# Patient Record
Sex: Female | Born: 1935 | Race: White | Hispanic: No | Smoking: Never smoker
Health system: Southern US, Community
[De-identification: ages and names within clinical notes are randomized; demographics above are authoritative.]

## PROBLEM LIST (undated history)

## (undated) DIAGNOSIS — E785 Hyperlipidemia, unspecified: Secondary | ICD-10-CM

## (undated) DIAGNOSIS — I10 Essential (primary) hypertension: Secondary | ICD-10-CM

## (undated) DIAGNOSIS — K219 Gastro-esophageal reflux disease without esophagitis: Secondary | ICD-10-CM

## (undated) DIAGNOSIS — H3530 Unspecified macular degeneration: Secondary | ICD-10-CM

## (undated) DIAGNOSIS — H409 Unspecified glaucoma: Secondary | ICD-10-CM

## (undated) DIAGNOSIS — E119 Type 2 diabetes mellitus without complications: Secondary | ICD-10-CM

## (undated) HISTORY — DX: Unspecified macular degeneration: H35.30

## (undated) HISTORY — DX: Hyperlipidemia, unspecified: E78.5

## (undated) HISTORY — DX: Gastro-esophageal reflux disease without esophagitis: K21.9

## (undated) HISTORY — DX: Unspecified glaucoma: H40.9

## (undated) HISTORY — DX: Type 2 diabetes mellitus without complications: E11.9

## (undated) HISTORY — DX: Essential (primary) hypertension: I10

---

## 1998-12-12 ENCOUNTER — Emergency Department (HOSPITAL_COMMUNITY): Admission: EM | Admit: 1998-12-12 | Discharge: 1998-12-12 | Payer: Self-pay | Admitting: Emergency Medicine

## 1998-12-12 ENCOUNTER — Encounter: Payer: Self-pay | Admitting: Emergency Medicine

## 1999-04-09 ENCOUNTER — Other Ambulatory Visit: Admission: RE | Admit: 1999-04-09 | Discharge: 1999-04-09 | Payer: Self-pay | Admitting: Family Medicine

## 2002-06-28 ENCOUNTER — Other Ambulatory Visit: Admission: RE | Admit: 2002-06-28 | Discharge: 2002-06-28 | Payer: Self-pay | Admitting: Family Medicine

## 2002-07-24 ENCOUNTER — Encounter: Admission: RE | Admit: 2002-07-24 | Discharge: 2002-10-22 | Payer: Self-pay | Admitting: Family Medicine

## 2003-07-31 ENCOUNTER — Encounter: Admission: RE | Admit: 2003-07-31 | Discharge: 2003-10-29 | Payer: Self-pay | Admitting: Family Medicine

## 2003-11-02 ENCOUNTER — Encounter: Admission: RE | Admit: 2003-11-02 | Discharge: 2003-11-02 | Payer: Self-pay | Admitting: Family Medicine

## 2004-04-08 ENCOUNTER — Ambulatory Visit (HOSPITAL_COMMUNITY): Admission: RE | Admit: 2004-04-08 | Discharge: 2004-04-08 | Payer: Self-pay | Admitting: *Deleted

## 2011-09-28 ENCOUNTER — Ambulatory Visit
Admission: RE | Admit: 2011-09-28 | Discharge: 2011-09-28 | Disposition: A | Discharge status: Home / Self Care | Payer: Medicare Other | Source: Ambulatory Visit | Attending: Family Medicine | Admitting: Family Medicine

## 2011-09-28 ENCOUNTER — Other Ambulatory Visit: Payer: Self-pay | Admitting: Family Medicine

## 2011-09-28 DIAGNOSIS — R42 Dizziness and giddiness: Secondary | ICD-10-CM

## 2011-11-18 DIAGNOSIS — I69998 Other sequelae following unspecified cerebrovascular disease: Secondary | ICD-10-CM | POA: Diagnosis not present

## 2011-11-20 DIAGNOSIS — R42 Dizziness and giddiness: Secondary | ICD-10-CM | POA: Diagnosis not present

## 2011-11-24 DIAGNOSIS — R42 Dizziness and giddiness: Secondary | ICD-10-CM | POA: Diagnosis not present

## 2011-11-26 DIAGNOSIS — R42 Dizziness and giddiness: Secondary | ICD-10-CM | POA: Diagnosis not present

## 2011-12-01 DIAGNOSIS — I69998 Other sequelae following unspecified cerebrovascular disease: Secondary | ICD-10-CM | POA: Diagnosis not present

## 2011-12-03 DIAGNOSIS — I69998 Other sequelae following unspecified cerebrovascular disease: Secondary | ICD-10-CM | POA: Diagnosis not present

## 2011-12-08 DIAGNOSIS — R42 Dizziness and giddiness: Secondary | ICD-10-CM | POA: Diagnosis not present

## 2011-12-10 DIAGNOSIS — R42 Dizziness and giddiness: Secondary | ICD-10-CM | POA: Diagnosis not present

## 2011-12-14 DIAGNOSIS — R42 Dizziness and giddiness: Secondary | ICD-10-CM | POA: Diagnosis not present

## 2011-12-14 DIAGNOSIS — I69998 Other sequelae following unspecified cerebrovascular disease: Secondary | ICD-10-CM | POA: Diagnosis not present

## 2011-12-17 DIAGNOSIS — R42 Dizziness and giddiness: Secondary | ICD-10-CM | POA: Diagnosis not present

## 2012-04-29 DIAGNOSIS — E119 Type 2 diabetes mellitus without complications: Secondary | ICD-10-CM | POA: Diagnosis not present

## 2012-04-29 DIAGNOSIS — E7800 Pure hypercholesterolemia, unspecified: Secondary | ICD-10-CM | POA: Diagnosis not present

## 2012-05-09 DIAGNOSIS — E119 Type 2 diabetes mellitus without complications: Secondary | ICD-10-CM | POA: Diagnosis not present

## 2012-05-09 DIAGNOSIS — M810 Age-related osteoporosis without current pathological fracture: Secondary | ICD-10-CM | POA: Diagnosis not present

## 2012-05-09 DIAGNOSIS — R42 Dizziness and giddiness: Secondary | ICD-10-CM | POA: Diagnosis not present

## 2012-05-09 DIAGNOSIS — E7800 Pure hypercholesterolemia, unspecified: Secondary | ICD-10-CM | POA: Diagnosis not present

## 2012-05-09 DIAGNOSIS — I10 Essential (primary) hypertension: Secondary | ICD-10-CM | POA: Diagnosis not present

## 2012-07-30 DIAGNOSIS — Z23 Encounter for immunization: Secondary | ICD-10-CM | POA: Diagnosis not present

## 2012-10-05 DIAGNOSIS — H04129 Dry eye syndrome of unspecified lacrimal gland: Secondary | ICD-10-CM | POA: Diagnosis not present

## 2012-10-05 DIAGNOSIS — H3530 Unspecified macular degeneration: Secondary | ICD-10-CM | POA: Diagnosis not present

## 2012-10-05 DIAGNOSIS — E119 Type 2 diabetes mellitus without complications: Secondary | ICD-10-CM | POA: Diagnosis not present

## 2012-10-05 DIAGNOSIS — H2640 Unspecified secondary cataract: Secondary | ICD-10-CM | POA: Diagnosis not present

## 2012-10-11 DIAGNOSIS — H2640 Unspecified secondary cataract: Secondary | ICD-10-CM | POA: Diagnosis not present

## 2012-10-11 DIAGNOSIS — H26499 Other secondary cataract, unspecified eye: Secondary | ICD-10-CM | POA: Diagnosis not present

## 2012-10-21 DIAGNOSIS — E119 Type 2 diabetes mellitus without complications: Secondary | ICD-10-CM | POA: Diagnosis not present

## 2012-10-21 DIAGNOSIS — E7800 Pure hypercholesterolemia, unspecified: Secondary | ICD-10-CM | POA: Diagnosis not present

## 2012-10-26 DIAGNOSIS — I10 Essential (primary) hypertension: Secondary | ICD-10-CM | POA: Diagnosis not present

## 2012-10-26 DIAGNOSIS — R209 Unspecified disturbances of skin sensation: Secondary | ICD-10-CM | POA: Diagnosis not present

## 2012-10-26 DIAGNOSIS — E7800 Pure hypercholesterolemia, unspecified: Secondary | ICD-10-CM | POA: Diagnosis not present

## 2012-10-26 DIAGNOSIS — M810 Age-related osteoporosis without current pathological fracture: Secondary | ICD-10-CM | POA: Diagnosis not present

## 2012-10-26 DIAGNOSIS — R42 Dizziness and giddiness: Secondary | ICD-10-CM | POA: Diagnosis not present

## 2012-10-26 DIAGNOSIS — E119 Type 2 diabetes mellitus without complications: Secondary | ICD-10-CM | POA: Diagnosis not present

## 2013-02-01 DIAGNOSIS — M19079 Primary osteoarthritis, unspecified ankle and foot: Secondary | ICD-10-CM | POA: Diagnosis not present

## 2013-02-16 DIAGNOSIS — M19079 Primary osteoarthritis, unspecified ankle and foot: Secondary | ICD-10-CM | POA: Diagnosis not present

## 2013-04-07 DIAGNOSIS — E119 Type 2 diabetes mellitus without complications: Secondary | ICD-10-CM | POA: Diagnosis not present

## 2013-04-07 DIAGNOSIS — E7800 Pure hypercholesterolemia, unspecified: Secondary | ICD-10-CM | POA: Diagnosis not present

## 2013-04-13 DIAGNOSIS — R42 Dizziness and giddiness: Secondary | ICD-10-CM | POA: Diagnosis not present

## 2013-04-13 DIAGNOSIS — I10 Essential (primary) hypertension: Secondary | ICD-10-CM | POA: Diagnosis not present

## 2013-04-13 DIAGNOSIS — E119 Type 2 diabetes mellitus without complications: Secondary | ICD-10-CM | POA: Diagnosis not present

## 2013-04-13 DIAGNOSIS — R209 Unspecified disturbances of skin sensation: Secondary | ICD-10-CM | POA: Diagnosis not present

## 2013-04-13 DIAGNOSIS — M810 Age-related osteoporosis without current pathological fracture: Secondary | ICD-10-CM | POA: Diagnosis not present

## 2013-04-13 DIAGNOSIS — E7800 Pure hypercholesterolemia, unspecified: Secondary | ICD-10-CM | POA: Diagnosis not present

## 2013-08-09 DIAGNOSIS — H04129 Dry eye syndrome of unspecified lacrimal gland: Secondary | ICD-10-CM | POA: Diagnosis not present

## 2013-08-09 DIAGNOSIS — H3530 Unspecified macular degeneration: Secondary | ICD-10-CM | POA: Diagnosis not present

## 2013-08-09 DIAGNOSIS — E119 Type 2 diabetes mellitus without complications: Secondary | ICD-10-CM | POA: Diagnosis not present

## 2013-08-09 DIAGNOSIS — Z961 Presence of intraocular lens: Secondary | ICD-10-CM | POA: Diagnosis not present

## 2013-08-11 DIAGNOSIS — Z23 Encounter for immunization: Secondary | ICD-10-CM | POA: Diagnosis not present

## 2013-10-04 DIAGNOSIS — E119 Type 2 diabetes mellitus without complications: Secondary | ICD-10-CM | POA: Diagnosis not present

## 2013-10-04 DIAGNOSIS — E7800 Pure hypercholesterolemia, unspecified: Secondary | ICD-10-CM | POA: Diagnosis not present

## 2013-10-10 DIAGNOSIS — R29818 Other symptoms and signs involving the nervous system: Secondary | ICD-10-CM | POA: Diagnosis not present

## 2013-10-10 DIAGNOSIS — E7800 Pure hypercholesterolemia, unspecified: Secondary | ICD-10-CM | POA: Diagnosis not present

## 2013-10-10 DIAGNOSIS — I10 Essential (primary) hypertension: Secondary | ICD-10-CM | POA: Diagnosis not present

## 2013-10-10 DIAGNOSIS — M810 Age-related osteoporosis without current pathological fracture: Secondary | ICD-10-CM | POA: Diagnosis not present

## 2013-10-10 DIAGNOSIS — E119 Type 2 diabetes mellitus without complications: Secondary | ICD-10-CM | POA: Diagnosis not present

## 2013-10-10 DIAGNOSIS — R32 Unspecified urinary incontinence: Secondary | ICD-10-CM | POA: Diagnosis not present

## 2013-10-20 DIAGNOSIS — M25579 Pain in unspecified ankle and joints of unspecified foot: Secondary | ICD-10-CM | POA: Diagnosis not present

## 2013-10-20 DIAGNOSIS — R269 Unspecified abnormalities of gait and mobility: Secondary | ICD-10-CM | POA: Diagnosis not present

## 2013-10-24 DIAGNOSIS — M25579 Pain in unspecified ankle and joints of unspecified foot: Secondary | ICD-10-CM | POA: Diagnosis not present

## 2013-10-24 DIAGNOSIS — R269 Unspecified abnormalities of gait and mobility: Secondary | ICD-10-CM | POA: Diagnosis not present

## 2013-10-27 DIAGNOSIS — M25579 Pain in unspecified ankle and joints of unspecified foot: Secondary | ICD-10-CM | POA: Diagnosis not present

## 2013-10-27 DIAGNOSIS — R269 Unspecified abnormalities of gait and mobility: Secondary | ICD-10-CM | POA: Diagnosis not present

## 2013-10-31 DIAGNOSIS — M25579 Pain in unspecified ankle and joints of unspecified foot: Secondary | ICD-10-CM | POA: Diagnosis not present

## 2013-10-31 DIAGNOSIS — R269 Unspecified abnormalities of gait and mobility: Secondary | ICD-10-CM | POA: Diagnosis not present

## 2013-11-02 DIAGNOSIS — R269 Unspecified abnormalities of gait and mobility: Secondary | ICD-10-CM | POA: Diagnosis not present

## 2013-11-02 DIAGNOSIS — M25579 Pain in unspecified ankle and joints of unspecified foot: Secondary | ICD-10-CM | POA: Diagnosis not present

## 2013-11-07 DIAGNOSIS — M25579 Pain in unspecified ankle and joints of unspecified foot: Secondary | ICD-10-CM | POA: Diagnosis not present

## 2013-11-07 DIAGNOSIS — R269 Unspecified abnormalities of gait and mobility: Secondary | ICD-10-CM | POA: Diagnosis not present

## 2013-11-14 DIAGNOSIS — M25579 Pain in unspecified ankle and joints of unspecified foot: Secondary | ICD-10-CM | POA: Diagnosis not present

## 2013-11-14 DIAGNOSIS — R269 Unspecified abnormalities of gait and mobility: Secondary | ICD-10-CM | POA: Diagnosis not present

## 2013-11-17 DIAGNOSIS — R269 Unspecified abnormalities of gait and mobility: Secondary | ICD-10-CM | POA: Diagnosis not present

## 2013-11-17 DIAGNOSIS — M25579 Pain in unspecified ankle and joints of unspecified foot: Secondary | ICD-10-CM | POA: Diagnosis not present

## 2013-11-28 DIAGNOSIS — R269 Unspecified abnormalities of gait and mobility: Secondary | ICD-10-CM | POA: Diagnosis not present

## 2013-11-28 DIAGNOSIS — M25579 Pain in unspecified ankle and joints of unspecified foot: Secondary | ICD-10-CM | POA: Diagnosis not present

## 2013-11-30 DIAGNOSIS — R269 Unspecified abnormalities of gait and mobility: Secondary | ICD-10-CM | POA: Diagnosis not present

## 2013-11-30 DIAGNOSIS — M25579 Pain in unspecified ankle and joints of unspecified foot: Secondary | ICD-10-CM | POA: Diagnosis not present

## 2013-12-05 DIAGNOSIS — R269 Unspecified abnormalities of gait and mobility: Secondary | ICD-10-CM | POA: Diagnosis not present

## 2013-12-05 DIAGNOSIS — M25579 Pain in unspecified ankle and joints of unspecified foot: Secondary | ICD-10-CM | POA: Diagnosis not present

## 2013-12-07 DIAGNOSIS — R269 Unspecified abnormalities of gait and mobility: Secondary | ICD-10-CM | POA: Diagnosis not present

## 2013-12-07 DIAGNOSIS — M25579 Pain in unspecified ankle and joints of unspecified foot: Secondary | ICD-10-CM | POA: Diagnosis not present

## 2013-12-12 DIAGNOSIS — R269 Unspecified abnormalities of gait and mobility: Secondary | ICD-10-CM | POA: Diagnosis not present

## 2013-12-12 DIAGNOSIS — M25579 Pain in unspecified ankle and joints of unspecified foot: Secondary | ICD-10-CM | POA: Diagnosis not present

## 2013-12-14 DIAGNOSIS — R269 Unspecified abnormalities of gait and mobility: Secondary | ICD-10-CM | POA: Diagnosis not present

## 2013-12-14 DIAGNOSIS — M25579 Pain in unspecified ankle and joints of unspecified foot: Secondary | ICD-10-CM | POA: Diagnosis not present

## 2013-12-19 DIAGNOSIS — M25579 Pain in unspecified ankle and joints of unspecified foot: Secondary | ICD-10-CM | POA: Diagnosis not present

## 2013-12-19 DIAGNOSIS — R269 Unspecified abnormalities of gait and mobility: Secondary | ICD-10-CM | POA: Diagnosis not present

## 2013-12-21 DIAGNOSIS — R269 Unspecified abnormalities of gait and mobility: Secondary | ICD-10-CM | POA: Diagnosis not present

## 2013-12-21 DIAGNOSIS — M25579 Pain in unspecified ankle and joints of unspecified foot: Secondary | ICD-10-CM | POA: Diagnosis not present

## 2013-12-26 DIAGNOSIS — M25579 Pain in unspecified ankle and joints of unspecified foot: Secondary | ICD-10-CM | POA: Diagnosis not present

## 2013-12-26 DIAGNOSIS — R269 Unspecified abnormalities of gait and mobility: Secondary | ICD-10-CM | POA: Diagnosis not present

## 2013-12-28 DIAGNOSIS — R269 Unspecified abnormalities of gait and mobility: Secondary | ICD-10-CM | POA: Diagnosis not present

## 2013-12-28 DIAGNOSIS — M25579 Pain in unspecified ankle and joints of unspecified foot: Secondary | ICD-10-CM | POA: Diagnosis not present

## 2014-01-23 DIAGNOSIS — M25579 Pain in unspecified ankle and joints of unspecified foot: Secondary | ICD-10-CM | POA: Diagnosis not present

## 2014-01-23 DIAGNOSIS — R269 Unspecified abnormalities of gait and mobility: Secondary | ICD-10-CM | POA: Diagnosis not present

## 2014-01-25 DIAGNOSIS — M25579 Pain in unspecified ankle and joints of unspecified foot: Secondary | ICD-10-CM | POA: Diagnosis not present

## 2014-01-25 DIAGNOSIS — R269 Unspecified abnormalities of gait and mobility: Secondary | ICD-10-CM | POA: Diagnosis not present

## 2014-01-30 DIAGNOSIS — M25579 Pain in unspecified ankle and joints of unspecified foot: Secondary | ICD-10-CM | POA: Diagnosis not present

## 2014-01-30 DIAGNOSIS — R269 Unspecified abnormalities of gait and mobility: Secondary | ICD-10-CM | POA: Diagnosis not present

## 2014-02-01 DIAGNOSIS — M25579 Pain in unspecified ankle and joints of unspecified foot: Secondary | ICD-10-CM | POA: Diagnosis not present

## 2014-02-01 DIAGNOSIS — R269 Unspecified abnormalities of gait and mobility: Secondary | ICD-10-CM | POA: Diagnosis not present

## 2014-02-06 DIAGNOSIS — M25579 Pain in unspecified ankle and joints of unspecified foot: Secondary | ICD-10-CM | POA: Diagnosis not present

## 2014-02-06 DIAGNOSIS — R269 Unspecified abnormalities of gait and mobility: Secondary | ICD-10-CM | POA: Diagnosis not present

## 2014-02-08 DIAGNOSIS — M25579 Pain in unspecified ankle and joints of unspecified foot: Secondary | ICD-10-CM | POA: Diagnosis not present

## 2014-02-08 DIAGNOSIS — R269 Unspecified abnormalities of gait and mobility: Secondary | ICD-10-CM | POA: Diagnosis not present

## 2014-02-13 DIAGNOSIS — R269 Unspecified abnormalities of gait and mobility: Secondary | ICD-10-CM | POA: Diagnosis not present

## 2014-02-13 DIAGNOSIS — M25579 Pain in unspecified ankle and joints of unspecified foot: Secondary | ICD-10-CM | POA: Diagnosis not present

## 2014-04-02 DIAGNOSIS — E7800 Pure hypercholesterolemia, unspecified: Secondary | ICD-10-CM | POA: Diagnosis not present

## 2014-04-02 DIAGNOSIS — Z79899 Other long term (current) drug therapy: Secondary | ICD-10-CM | POA: Diagnosis not present

## 2014-04-05 DIAGNOSIS — E119 Type 2 diabetes mellitus without complications: Secondary | ICD-10-CM | POA: Diagnosis not present

## 2014-04-05 DIAGNOSIS — E7800 Pure hypercholesterolemia, unspecified: Secondary | ICD-10-CM | POA: Diagnosis not present

## 2014-04-05 DIAGNOSIS — R29818 Other symptoms and signs involving the nervous system: Secondary | ICD-10-CM | POA: Diagnosis not present

## 2014-04-05 DIAGNOSIS — M810 Age-related osteoporosis without current pathological fracture: Secondary | ICD-10-CM | POA: Diagnosis not present

## 2014-04-05 DIAGNOSIS — I10 Essential (primary) hypertension: Secondary | ICD-10-CM | POA: Diagnosis not present

## 2014-06-23 ENCOUNTER — Encounter: Payer: Self-pay | Admitting: *Deleted

## 2014-06-23 DIAGNOSIS — I10 Essential (primary) hypertension: Secondary | ICD-10-CM

## 2014-06-23 DIAGNOSIS — E785 Hyperlipidemia, unspecified: Secondary | ICD-10-CM | POA: Insufficient documentation

## 2014-08-13 DIAGNOSIS — E119 Type 2 diabetes mellitus without complications: Secondary | ICD-10-CM | POA: Diagnosis not present

## 2014-08-13 DIAGNOSIS — D3130 Benign neoplasm of unspecified choroid: Secondary | ICD-10-CM | POA: Diagnosis not present

## 2014-08-13 DIAGNOSIS — Z961 Presence of intraocular lens: Secondary | ICD-10-CM | POA: Diagnosis not present

## 2014-08-13 DIAGNOSIS — H3530 Unspecified macular degeneration: Secondary | ICD-10-CM | POA: Diagnosis not present

## 2014-08-30 DIAGNOSIS — Z23 Encounter for immunization: Secondary | ICD-10-CM | POA: Diagnosis not present

## 2014-10-02 DIAGNOSIS — E119 Type 2 diabetes mellitus without complications: Secondary | ICD-10-CM | POA: Diagnosis not present

## 2014-10-02 DIAGNOSIS — E780 Pure hypercholesterolemia: Secondary | ICD-10-CM | POA: Diagnosis not present

## 2014-10-08 DIAGNOSIS — I10 Essential (primary) hypertension: Secondary | ICD-10-CM | POA: Diagnosis not present

## 2014-10-08 DIAGNOSIS — E780 Pure hypercholesterolemia: Secondary | ICD-10-CM | POA: Diagnosis not present

## 2014-10-08 DIAGNOSIS — E119 Type 2 diabetes mellitus without complications: Secondary | ICD-10-CM | POA: Diagnosis not present

## 2015-03-20 DIAGNOSIS — M79641 Pain in right hand: Secondary | ICD-10-CM | POA: Diagnosis not present

## 2015-03-25 DIAGNOSIS — M65341 Trigger finger, right ring finger: Secondary | ICD-10-CM | POA: Diagnosis not present

## 2015-03-25 DIAGNOSIS — M152 Bouchard's nodes (with arthropathy): Secondary | ICD-10-CM | POA: Diagnosis not present

## 2015-04-01 DIAGNOSIS — E119 Type 2 diabetes mellitus without complications: Secondary | ICD-10-CM | POA: Diagnosis not present

## 2015-04-01 DIAGNOSIS — E780 Pure hypercholesterolemia: Secondary | ICD-10-CM | POA: Diagnosis not present

## 2015-04-10 DIAGNOSIS — I10 Essential (primary) hypertension: Secondary | ICD-10-CM | POA: Diagnosis not present

## 2015-04-10 DIAGNOSIS — R2689 Other abnormalities of gait and mobility: Secondary | ICD-10-CM | POA: Diagnosis not present

## 2015-04-10 DIAGNOSIS — E119 Type 2 diabetes mellitus without complications: Secondary | ICD-10-CM | POA: Diagnosis not present

## 2015-04-10 DIAGNOSIS — E780 Pure hypercholesterolemia: Secondary | ICD-10-CM | POA: Diagnosis not present

## 2015-04-10 DIAGNOSIS — J309 Allergic rhinitis, unspecified: Secondary | ICD-10-CM | POA: Diagnosis not present

## 2015-04-24 DIAGNOSIS — M152 Bouchard's nodes (with arthropathy): Secondary | ICD-10-CM | POA: Diagnosis not present

## 2015-04-24 DIAGNOSIS — M65341 Trigger finger, right ring finger: Secondary | ICD-10-CM | POA: Diagnosis not present

## 2015-05-29 DIAGNOSIS — M65341 Trigger finger, right ring finger: Secondary | ICD-10-CM | POA: Diagnosis not present

## 2015-05-29 DIAGNOSIS — M152 Bouchard's nodes (with arthropathy): Secondary | ICD-10-CM | POA: Diagnosis not present

## 2015-08-23 DIAGNOSIS — H353131 Nonexudative age-related macular degeneration, bilateral, early dry stage: Secondary | ICD-10-CM | POA: Diagnosis not present

## 2015-08-23 DIAGNOSIS — H40012 Open angle with borderline findings, low risk, left eye: Secondary | ICD-10-CM | POA: Diagnosis not present

## 2015-08-23 DIAGNOSIS — D3131 Benign neoplasm of right choroid: Secondary | ICD-10-CM | POA: Diagnosis not present

## 2015-08-23 DIAGNOSIS — E119 Type 2 diabetes mellitus without complications: Secondary | ICD-10-CM | POA: Diagnosis not present

## 2015-08-30 DIAGNOSIS — Z23 Encounter for immunization: Secondary | ICD-10-CM | POA: Diagnosis not present

## 2015-10-07 DIAGNOSIS — E119 Type 2 diabetes mellitus without complications: Secondary | ICD-10-CM | POA: Diagnosis not present

## 2015-10-07 DIAGNOSIS — E7800 Pure hypercholesterolemia, unspecified: Secondary | ICD-10-CM | POA: Diagnosis not present

## 2015-10-07 DIAGNOSIS — Z7984 Long term (current) use of oral hypoglycemic drugs: Secondary | ICD-10-CM | POA: Diagnosis not present

## 2015-10-14 DIAGNOSIS — E119 Type 2 diabetes mellitus without complications: Secondary | ICD-10-CM | POA: Diagnosis not present

## 2015-10-14 DIAGNOSIS — I10 Essential (primary) hypertension: Secondary | ICD-10-CM | POA: Diagnosis not present

## 2015-10-14 DIAGNOSIS — R2689 Other abnormalities of gait and mobility: Secondary | ICD-10-CM | POA: Diagnosis not present

## 2015-10-14 DIAGNOSIS — R809 Proteinuria, unspecified: Secondary | ICD-10-CM | POA: Diagnosis not present

## 2015-10-14 DIAGNOSIS — E1129 Type 2 diabetes mellitus with other diabetic kidney complication: Secondary | ICD-10-CM | POA: Diagnosis not present

## 2015-10-14 DIAGNOSIS — E7800 Pure hypercholesterolemia, unspecified: Secondary | ICD-10-CM | POA: Diagnosis not present

## 2015-10-14 DIAGNOSIS — J309 Allergic rhinitis, unspecified: Secondary | ICD-10-CM | POA: Diagnosis not present

## 2015-10-14 DIAGNOSIS — Z7984 Long term (current) use of oral hypoglycemic drugs: Secondary | ICD-10-CM | POA: Diagnosis not present

## 2015-12-12 DIAGNOSIS — I10 Essential (primary) hypertension: Secondary | ICD-10-CM | POA: Diagnosis not present

## 2015-12-12 DIAGNOSIS — R2689 Other abnormalities of gait and mobility: Secondary | ICD-10-CM | POA: Diagnosis not present

## 2015-12-12 DIAGNOSIS — Z7984 Long term (current) use of oral hypoglycemic drugs: Secondary | ICD-10-CM | POA: Diagnosis not present

## 2015-12-12 DIAGNOSIS — E1129 Type 2 diabetes mellitus with other diabetic kidney complication: Secondary | ICD-10-CM | POA: Diagnosis not present

## 2015-12-12 DIAGNOSIS — R55 Syncope and collapse: Secondary | ICD-10-CM | POA: Diagnosis not present

## 2015-12-26 DIAGNOSIS — D692 Other nonthrombocytopenic purpura: Secondary | ICD-10-CM | POA: Diagnosis not present

## 2015-12-26 DIAGNOSIS — B349 Viral infection, unspecified: Secondary | ICD-10-CM | POA: Diagnosis not present

## 2015-12-26 DIAGNOSIS — R42 Dizziness and giddiness: Secondary | ICD-10-CM | POA: Diagnosis not present

## 2015-12-26 DIAGNOSIS — I10 Essential (primary) hypertension: Secondary | ICD-10-CM | POA: Diagnosis not present

## 2016-01-17 DIAGNOSIS — I10 Essential (primary) hypertension: Secondary | ICD-10-CM | POA: Diagnosis not present

## 2016-02-06 DIAGNOSIS — Z961 Presence of intraocular lens: Secondary | ICD-10-CM | POA: Diagnosis not present

## 2016-02-06 DIAGNOSIS — H04123 Dry eye syndrome of bilateral lacrimal glands: Secondary | ICD-10-CM | POA: Diagnosis not present

## 2016-02-06 DIAGNOSIS — H40012 Open angle with borderline findings, low risk, left eye: Secondary | ICD-10-CM | POA: Diagnosis not present

## 2016-02-06 DIAGNOSIS — H16103 Unspecified superficial keratitis, bilateral: Secondary | ICD-10-CM | POA: Diagnosis not present

## 2016-03-30 DIAGNOSIS — E119 Type 2 diabetes mellitus without complications: Secondary | ICD-10-CM | POA: Diagnosis not present

## 2016-03-30 DIAGNOSIS — E7800 Pure hypercholesterolemia, unspecified: Secondary | ICD-10-CM | POA: Diagnosis not present

## 2016-03-30 DIAGNOSIS — Z7984 Long term (current) use of oral hypoglycemic drugs: Secondary | ICD-10-CM | POA: Diagnosis not present

## 2016-04-03 DIAGNOSIS — E1129 Type 2 diabetes mellitus with other diabetic kidney complication: Secondary | ICD-10-CM | POA: Diagnosis not present

## 2016-04-03 DIAGNOSIS — E7800 Pure hypercholesterolemia, unspecified: Secondary | ICD-10-CM | POA: Diagnosis not present

## 2016-04-03 DIAGNOSIS — I10 Essential (primary) hypertension: Secondary | ICD-10-CM | POA: Diagnosis not present

## 2016-04-03 DIAGNOSIS — Z1389 Encounter for screening for other disorder: Secondary | ICD-10-CM | POA: Diagnosis not present

## 2016-04-03 DIAGNOSIS — R809 Proteinuria, unspecified: Secondary | ICD-10-CM | POA: Diagnosis not present

## 2016-08-04 DIAGNOSIS — S60212A Contusion of left wrist, initial encounter: Secondary | ICD-10-CM | POA: Diagnosis not present

## 2016-08-14 DIAGNOSIS — H5340 Unspecified visual field defects: Secondary | ICD-10-CM | POA: Diagnosis not present

## 2016-08-14 DIAGNOSIS — Z961 Presence of intraocular lens: Secondary | ICD-10-CM | POA: Diagnosis not present

## 2016-08-14 DIAGNOSIS — H40032 Anatomical narrow angle, left eye: Secondary | ICD-10-CM | POA: Diagnosis not present

## 2016-08-14 DIAGNOSIS — H40031 Anatomical narrow angle, right eye: Secondary | ICD-10-CM | POA: Diagnosis not present

## 2016-08-21 DIAGNOSIS — Z23 Encounter for immunization: Secondary | ICD-10-CM | POA: Diagnosis not present

## 2016-09-18 DIAGNOSIS — H04123 Dry eye syndrome of bilateral lacrimal glands: Secondary | ICD-10-CM | POA: Diagnosis not present

## 2016-09-18 DIAGNOSIS — H40051 Ocular hypertension, right eye: Secondary | ICD-10-CM | POA: Diagnosis not present

## 2016-09-18 DIAGNOSIS — H401122 Primary open-angle glaucoma, left eye, moderate stage: Secondary | ICD-10-CM | POA: Diagnosis not present

## 2016-10-01 DIAGNOSIS — E1129 Type 2 diabetes mellitus with other diabetic kidney complication: Secondary | ICD-10-CM | POA: Diagnosis not present

## 2016-10-01 DIAGNOSIS — Z7984 Long term (current) use of oral hypoglycemic drugs: Secondary | ICD-10-CM | POA: Diagnosis not present

## 2016-10-01 DIAGNOSIS — E7800 Pure hypercholesterolemia, unspecified: Secondary | ICD-10-CM | POA: Diagnosis not present

## 2016-10-06 DIAGNOSIS — R809 Proteinuria, unspecified: Secondary | ICD-10-CM | POA: Diagnosis not present

## 2016-10-06 DIAGNOSIS — E1129 Type 2 diabetes mellitus with other diabetic kidney complication: Secondary | ICD-10-CM | POA: Diagnosis not present

## 2016-10-06 DIAGNOSIS — R2689 Other abnormalities of gait and mobility: Secondary | ICD-10-CM | POA: Diagnosis not present

## 2016-10-06 DIAGNOSIS — H409 Unspecified glaucoma: Secondary | ICD-10-CM | POA: Diagnosis not present

## 2016-10-06 DIAGNOSIS — I10 Essential (primary) hypertension: Secondary | ICD-10-CM | POA: Diagnosis not present

## 2016-10-06 DIAGNOSIS — E7800 Pure hypercholesterolemia, unspecified: Secondary | ICD-10-CM | POA: Diagnosis not present

## 2016-10-19 DIAGNOSIS — R42 Dizziness and giddiness: Secondary | ICD-10-CM | POA: Diagnosis not present

## 2016-10-19 DIAGNOSIS — M6281 Muscle weakness (generalized): Secondary | ICD-10-CM | POA: Diagnosis not present

## 2016-10-19 DIAGNOSIS — R260 Ataxic gait: Secondary | ICD-10-CM | POA: Diagnosis not present

## 2016-10-19 DIAGNOSIS — R262 Difficulty in walking, not elsewhere classified: Secondary | ICD-10-CM | POA: Diagnosis not present

## 2016-10-26 DIAGNOSIS — R262 Difficulty in walking, not elsewhere classified: Secondary | ICD-10-CM | POA: Diagnosis not present

## 2016-10-26 DIAGNOSIS — R260 Ataxic gait: Secondary | ICD-10-CM | POA: Diagnosis not present

## 2016-10-26 DIAGNOSIS — M6281 Muscle weakness (generalized): Secondary | ICD-10-CM | POA: Diagnosis not present

## 2016-10-26 DIAGNOSIS — R42 Dizziness and giddiness: Secondary | ICD-10-CM | POA: Diagnosis not present

## 2016-10-28 DIAGNOSIS — R42 Dizziness and giddiness: Secondary | ICD-10-CM | POA: Diagnosis not present

## 2016-10-28 DIAGNOSIS — R262 Difficulty in walking, not elsewhere classified: Secondary | ICD-10-CM | POA: Diagnosis not present

## 2016-10-28 DIAGNOSIS — M6281 Muscle weakness (generalized): Secondary | ICD-10-CM | POA: Diagnosis not present

## 2016-10-28 DIAGNOSIS — R260 Ataxic gait: Secondary | ICD-10-CM | POA: Diagnosis not present

## 2016-11-02 DIAGNOSIS — R42 Dizziness and giddiness: Secondary | ICD-10-CM | POA: Diagnosis not present

## 2016-11-02 DIAGNOSIS — R260 Ataxic gait: Secondary | ICD-10-CM | POA: Diagnosis not present

## 2016-11-02 DIAGNOSIS — R262 Difficulty in walking, not elsewhere classified: Secondary | ICD-10-CM | POA: Diagnosis not present

## 2016-11-02 DIAGNOSIS — M6281 Muscle weakness (generalized): Secondary | ICD-10-CM | POA: Diagnosis not present

## 2016-11-04 DIAGNOSIS — R262 Difficulty in walking, not elsewhere classified: Secondary | ICD-10-CM | POA: Diagnosis not present

## 2016-11-04 DIAGNOSIS — R42 Dizziness and giddiness: Secondary | ICD-10-CM | POA: Diagnosis not present

## 2016-11-04 DIAGNOSIS — M6281 Muscle weakness (generalized): Secondary | ICD-10-CM | POA: Diagnosis not present

## 2016-11-04 DIAGNOSIS — R260 Ataxic gait: Secondary | ICD-10-CM | POA: Diagnosis not present

## 2016-12-15 DIAGNOSIS — M6281 Muscle weakness (generalized): Secondary | ICD-10-CM | POA: Diagnosis not present

## 2016-12-15 DIAGNOSIS — R260 Ataxic gait: Secondary | ICD-10-CM | POA: Diagnosis not present

## 2016-12-15 DIAGNOSIS — R262 Difficulty in walking, not elsewhere classified: Secondary | ICD-10-CM | POA: Diagnosis not present

## 2016-12-15 DIAGNOSIS — R42 Dizziness and giddiness: Secondary | ICD-10-CM | POA: Diagnosis not present

## 2016-12-17 DIAGNOSIS — R42 Dizziness and giddiness: Secondary | ICD-10-CM | POA: Diagnosis not present

## 2016-12-17 DIAGNOSIS — R260 Ataxic gait: Secondary | ICD-10-CM | POA: Diagnosis not present

## 2016-12-17 DIAGNOSIS — R262 Difficulty in walking, not elsewhere classified: Secondary | ICD-10-CM | POA: Diagnosis not present

## 2016-12-17 DIAGNOSIS — M6281 Muscle weakness (generalized): Secondary | ICD-10-CM | POA: Diagnosis not present

## 2016-12-29 DIAGNOSIS — M6281 Muscle weakness (generalized): Secondary | ICD-10-CM | POA: Diagnosis not present

## 2016-12-29 DIAGNOSIS — R262 Difficulty in walking, not elsewhere classified: Secondary | ICD-10-CM | POA: Diagnosis not present

## 2016-12-29 DIAGNOSIS — R42 Dizziness and giddiness: Secondary | ICD-10-CM | POA: Diagnosis not present

## 2016-12-29 DIAGNOSIS — R260 Ataxic gait: Secondary | ICD-10-CM | POA: Diagnosis not present

## 2017-01-07 DIAGNOSIS — R262 Difficulty in walking, not elsewhere classified: Secondary | ICD-10-CM | POA: Diagnosis not present

## 2017-01-07 DIAGNOSIS — R42 Dizziness and giddiness: Secondary | ICD-10-CM | POA: Diagnosis not present

## 2017-01-07 DIAGNOSIS — R260 Ataxic gait: Secondary | ICD-10-CM | POA: Diagnosis not present

## 2017-01-07 DIAGNOSIS — M6281 Muscle weakness (generalized): Secondary | ICD-10-CM | POA: Diagnosis not present

## 2017-01-12 DIAGNOSIS — R260 Ataxic gait: Secondary | ICD-10-CM | POA: Diagnosis not present

## 2017-01-12 DIAGNOSIS — M6281 Muscle weakness (generalized): Secondary | ICD-10-CM | POA: Diagnosis not present

## 2017-01-12 DIAGNOSIS — R262 Difficulty in walking, not elsewhere classified: Secondary | ICD-10-CM | POA: Diagnosis not present

## 2017-01-12 DIAGNOSIS — R42 Dizziness and giddiness: Secondary | ICD-10-CM | POA: Diagnosis not present

## 2017-01-18 DIAGNOSIS — M6281 Muscle weakness (generalized): Secondary | ICD-10-CM | POA: Diagnosis not present

## 2017-01-18 DIAGNOSIS — R262 Difficulty in walking, not elsewhere classified: Secondary | ICD-10-CM | POA: Diagnosis not present

## 2017-01-18 DIAGNOSIS — R260 Ataxic gait: Secondary | ICD-10-CM | POA: Diagnosis not present

## 2017-01-18 DIAGNOSIS — R42 Dizziness and giddiness: Secondary | ICD-10-CM | POA: Diagnosis not present

## 2017-01-21 DIAGNOSIS — H40051 Ocular hypertension, right eye: Secondary | ICD-10-CM | POA: Diagnosis not present

## 2017-01-21 DIAGNOSIS — H401122 Primary open-angle glaucoma, left eye, moderate stage: Secondary | ICD-10-CM | POA: Diagnosis not present

## 2017-01-21 DIAGNOSIS — H5203 Hypermetropia, bilateral: Secondary | ICD-10-CM | POA: Diagnosis not present

## 2017-01-21 DIAGNOSIS — Z961 Presence of intraocular lens: Secondary | ICD-10-CM | POA: Diagnosis not present

## 2017-01-27 DIAGNOSIS — R2689 Other abnormalities of gait and mobility: Secondary | ICD-10-CM | POA: Diagnosis not present

## 2017-01-27 DIAGNOSIS — E1142 Type 2 diabetes mellitus with diabetic polyneuropathy: Secondary | ICD-10-CM | POA: Diagnosis not present

## 2017-01-27 DIAGNOSIS — D692 Other nonthrombocytopenic purpura: Secondary | ICD-10-CM | POA: Diagnosis not present

## 2017-01-27 DIAGNOSIS — R42 Dizziness and giddiness: Secondary | ICD-10-CM | POA: Diagnosis not present

## 2017-01-27 DIAGNOSIS — H9209 Otalgia, unspecified ear: Secondary | ICD-10-CM | POA: Diagnosis not present

## 2017-01-27 DIAGNOSIS — J309 Allergic rhinitis, unspecified: Secondary | ICD-10-CM | POA: Diagnosis not present

## 2017-01-28 DIAGNOSIS — R260 Ataxic gait: Secondary | ICD-10-CM | POA: Diagnosis not present

## 2017-01-28 DIAGNOSIS — R262 Difficulty in walking, not elsewhere classified: Secondary | ICD-10-CM | POA: Diagnosis not present

## 2017-01-28 DIAGNOSIS — M6281 Muscle weakness (generalized): Secondary | ICD-10-CM | POA: Diagnosis not present

## 2017-01-28 DIAGNOSIS — R42 Dizziness and giddiness: Secondary | ICD-10-CM | POA: Diagnosis not present

## 2017-02-01 DIAGNOSIS — R260 Ataxic gait: Secondary | ICD-10-CM | POA: Diagnosis not present

## 2017-02-01 DIAGNOSIS — R42 Dizziness and giddiness: Secondary | ICD-10-CM | POA: Diagnosis not present

## 2017-02-01 DIAGNOSIS — R262 Difficulty in walking, not elsewhere classified: Secondary | ICD-10-CM | POA: Diagnosis not present

## 2017-02-01 DIAGNOSIS — M6281 Muscle weakness (generalized): Secondary | ICD-10-CM | POA: Diagnosis not present

## 2017-02-10 DIAGNOSIS — R42 Dizziness and giddiness: Secondary | ICD-10-CM | POA: Diagnosis not present

## 2017-02-10 DIAGNOSIS — H903 Sensorineural hearing loss, bilateral: Secondary | ICD-10-CM | POA: Diagnosis not present

## 2017-02-11 DIAGNOSIS — R260 Ataxic gait: Secondary | ICD-10-CM | POA: Diagnosis not present

## 2017-02-11 DIAGNOSIS — M6281 Muscle weakness (generalized): Secondary | ICD-10-CM | POA: Diagnosis not present

## 2017-02-11 DIAGNOSIS — R42 Dizziness and giddiness: Secondary | ICD-10-CM | POA: Diagnosis not present

## 2017-02-11 DIAGNOSIS — R262 Difficulty in walking, not elsewhere classified: Secondary | ICD-10-CM | POA: Diagnosis not present

## 2017-02-15 DIAGNOSIS — M6281 Muscle weakness (generalized): Secondary | ICD-10-CM | POA: Diagnosis not present

## 2017-02-15 DIAGNOSIS — R262 Difficulty in walking, not elsewhere classified: Secondary | ICD-10-CM | POA: Diagnosis not present

## 2017-02-15 DIAGNOSIS — R260 Ataxic gait: Secondary | ICD-10-CM | POA: Diagnosis not present

## 2017-02-15 DIAGNOSIS — R42 Dizziness and giddiness: Secondary | ICD-10-CM | POA: Diagnosis not present

## 2017-02-22 DIAGNOSIS — M6281 Muscle weakness (generalized): Secondary | ICD-10-CM | POA: Diagnosis not present

## 2017-02-22 DIAGNOSIS — R260 Ataxic gait: Secondary | ICD-10-CM | POA: Diagnosis not present

## 2017-02-22 DIAGNOSIS — R262 Difficulty in walking, not elsewhere classified: Secondary | ICD-10-CM | POA: Diagnosis not present

## 2017-02-22 DIAGNOSIS — R42 Dizziness and giddiness: Secondary | ICD-10-CM | POA: Diagnosis not present

## 2017-02-24 DIAGNOSIS — R262 Difficulty in walking, not elsewhere classified: Secondary | ICD-10-CM | POA: Diagnosis not present

## 2017-02-24 DIAGNOSIS — R260 Ataxic gait: Secondary | ICD-10-CM | POA: Diagnosis not present

## 2017-02-24 DIAGNOSIS — M6281 Muscle weakness (generalized): Secondary | ICD-10-CM | POA: Diagnosis not present

## 2017-02-24 DIAGNOSIS — R42 Dizziness and giddiness: Secondary | ICD-10-CM | POA: Diagnosis not present

## 2017-03-01 DIAGNOSIS — M84364A Stress fracture, left fibula, initial encounter for fracture: Secondary | ICD-10-CM | POA: Diagnosis not present

## 2017-03-15 DIAGNOSIS — M84364A Stress fracture, left fibula, initial encounter for fracture: Secondary | ICD-10-CM | POA: Diagnosis not present

## 2017-03-29 DIAGNOSIS — M84364D Stress fracture, left fibula, subsequent encounter for fracture with routine healing: Secondary | ICD-10-CM | POA: Diagnosis not present

## 2017-04-16 DIAGNOSIS — Z7984 Long term (current) use of oral hypoglycemic drugs: Secondary | ICD-10-CM | POA: Diagnosis not present

## 2017-04-16 DIAGNOSIS — E7800 Pure hypercholesterolemia, unspecified: Secondary | ICD-10-CM | POA: Diagnosis not present

## 2017-04-16 DIAGNOSIS — E1129 Type 2 diabetes mellitus with other diabetic kidney complication: Secondary | ICD-10-CM | POA: Diagnosis not present

## 2017-04-21 DIAGNOSIS — E7800 Pure hypercholesterolemia, unspecified: Secondary | ICD-10-CM | POA: Diagnosis not present

## 2017-04-21 DIAGNOSIS — D692 Other nonthrombocytopenic purpura: Secondary | ICD-10-CM | POA: Diagnosis not present

## 2017-04-21 DIAGNOSIS — Z7984 Long term (current) use of oral hypoglycemic drugs: Secondary | ICD-10-CM | POA: Diagnosis not present

## 2017-04-21 DIAGNOSIS — E1129 Type 2 diabetes mellitus with other diabetic kidney complication: Secondary | ICD-10-CM | POA: Diagnosis not present

## 2017-04-21 DIAGNOSIS — I10 Essential (primary) hypertension: Secondary | ICD-10-CM | POA: Diagnosis not present

## 2017-04-21 DIAGNOSIS — R2689 Other abnormalities of gait and mobility: Secondary | ICD-10-CM | POA: Diagnosis not present

## 2017-04-21 DIAGNOSIS — M84369A Stress fracture, unspecified tibia and fibula, initial encounter for fracture: Secondary | ICD-10-CM | POA: Diagnosis not present

## 2017-04-21 DIAGNOSIS — S30861A Insect bite (nonvenomous) of abdominal wall, initial encounter: Secondary | ICD-10-CM | POA: Diagnosis not present

## 2017-04-21 DIAGNOSIS — H409 Unspecified glaucoma: Secondary | ICD-10-CM | POA: Diagnosis not present

## 2017-04-21 DIAGNOSIS — E1142 Type 2 diabetes mellitus with diabetic polyneuropathy: Secondary | ICD-10-CM | POA: Diagnosis not present

## 2017-04-21 DIAGNOSIS — J309 Allergic rhinitis, unspecified: Secondary | ICD-10-CM | POA: Diagnosis not present

## 2017-04-21 DIAGNOSIS — R42 Dizziness and giddiness: Secondary | ICD-10-CM | POA: Diagnosis not present

## 2017-04-27 DIAGNOSIS — R42 Dizziness and giddiness: Secondary | ICD-10-CM | POA: Diagnosis not present

## 2017-04-27 DIAGNOSIS — R260 Ataxic gait: Secondary | ICD-10-CM | POA: Diagnosis not present

## 2017-04-27 DIAGNOSIS — R262 Difficulty in walking, not elsewhere classified: Secondary | ICD-10-CM | POA: Diagnosis not present

## 2017-04-27 DIAGNOSIS — M6281 Muscle weakness (generalized): Secondary | ICD-10-CM | POA: Diagnosis not present

## 2017-04-29 DIAGNOSIS — R42 Dizziness and giddiness: Secondary | ICD-10-CM | POA: Diagnosis not present

## 2017-04-29 DIAGNOSIS — R262 Difficulty in walking, not elsewhere classified: Secondary | ICD-10-CM | POA: Diagnosis not present

## 2017-04-29 DIAGNOSIS — R260 Ataxic gait: Secondary | ICD-10-CM | POA: Diagnosis not present

## 2017-04-29 DIAGNOSIS — M6281 Muscle weakness (generalized): Secondary | ICD-10-CM | POA: Diagnosis not present

## 2017-05-06 DIAGNOSIS — R262 Difficulty in walking, not elsewhere classified: Secondary | ICD-10-CM | POA: Diagnosis not present

## 2017-05-06 DIAGNOSIS — M6281 Muscle weakness (generalized): Secondary | ICD-10-CM | POA: Diagnosis not present

## 2017-05-06 DIAGNOSIS — R260 Ataxic gait: Secondary | ICD-10-CM | POA: Diagnosis not present

## 2017-05-06 DIAGNOSIS — R42 Dizziness and giddiness: Secondary | ICD-10-CM | POA: Diagnosis not present

## 2017-05-18 DIAGNOSIS — R262 Difficulty in walking, not elsewhere classified: Secondary | ICD-10-CM | POA: Diagnosis not present

## 2017-05-18 DIAGNOSIS — R260 Ataxic gait: Secondary | ICD-10-CM | POA: Diagnosis not present

## 2017-05-18 DIAGNOSIS — R42 Dizziness and giddiness: Secondary | ICD-10-CM | POA: Diagnosis not present

## 2017-05-18 DIAGNOSIS — M6281 Muscle weakness (generalized): Secondary | ICD-10-CM | POA: Diagnosis not present

## 2017-06-03 DIAGNOSIS — R262 Difficulty in walking, not elsewhere classified: Secondary | ICD-10-CM | POA: Diagnosis not present

## 2017-06-03 DIAGNOSIS — R260 Ataxic gait: Secondary | ICD-10-CM | POA: Diagnosis not present

## 2017-06-03 DIAGNOSIS — R42 Dizziness and giddiness: Secondary | ICD-10-CM | POA: Diagnosis not present

## 2017-06-03 DIAGNOSIS — M6281 Muscle weakness (generalized): Secondary | ICD-10-CM | POA: Diagnosis not present

## 2017-06-10 DIAGNOSIS — R42 Dizziness and giddiness: Secondary | ICD-10-CM | POA: Diagnosis not present

## 2017-06-10 DIAGNOSIS — R262 Difficulty in walking, not elsewhere classified: Secondary | ICD-10-CM | POA: Diagnosis not present

## 2017-06-10 DIAGNOSIS — R260 Ataxic gait: Secondary | ICD-10-CM | POA: Diagnosis not present

## 2017-06-10 DIAGNOSIS — M6281 Muscle weakness (generalized): Secondary | ICD-10-CM | POA: Diagnosis not present

## 2017-06-24 DIAGNOSIS — M6281 Muscle weakness (generalized): Secondary | ICD-10-CM | POA: Diagnosis not present

## 2017-06-24 DIAGNOSIS — R260 Ataxic gait: Secondary | ICD-10-CM | POA: Diagnosis not present

## 2017-06-24 DIAGNOSIS — R42 Dizziness and giddiness: Secondary | ICD-10-CM | POA: Diagnosis not present

## 2017-06-24 DIAGNOSIS — R262 Difficulty in walking, not elsewhere classified: Secondary | ICD-10-CM | POA: Diagnosis not present

## 2017-06-25 DIAGNOSIS — H401111 Primary open-angle glaucoma, right eye, mild stage: Secondary | ICD-10-CM | POA: Diagnosis not present

## 2017-06-25 DIAGNOSIS — H401123 Primary open-angle glaucoma, left eye, severe stage: Secondary | ICD-10-CM | POA: Diagnosis not present

## 2017-06-25 DIAGNOSIS — Z961 Presence of intraocular lens: Secondary | ICD-10-CM | POA: Diagnosis not present

## 2017-07-01 DIAGNOSIS — R262 Difficulty in walking, not elsewhere classified: Secondary | ICD-10-CM | POA: Diagnosis not present

## 2017-07-01 DIAGNOSIS — R260 Ataxic gait: Secondary | ICD-10-CM | POA: Diagnosis not present

## 2017-07-01 DIAGNOSIS — R42 Dizziness and giddiness: Secondary | ICD-10-CM | POA: Diagnosis not present

## 2017-07-01 DIAGNOSIS — M6281 Muscle weakness (generalized): Secondary | ICD-10-CM | POA: Diagnosis not present

## 2017-07-05 DIAGNOSIS — R262 Difficulty in walking, not elsewhere classified: Secondary | ICD-10-CM | POA: Diagnosis not present

## 2017-07-05 DIAGNOSIS — R42 Dizziness and giddiness: Secondary | ICD-10-CM | POA: Diagnosis not present

## 2017-07-05 DIAGNOSIS — R260 Ataxic gait: Secondary | ICD-10-CM | POA: Diagnosis not present

## 2017-07-05 DIAGNOSIS — M6281 Muscle weakness (generalized): Secondary | ICD-10-CM | POA: Diagnosis not present

## 2017-07-21 DIAGNOSIS — H401122 Primary open-angle glaucoma, left eye, moderate stage: Secondary | ICD-10-CM | POA: Diagnosis not present

## 2017-07-26 DIAGNOSIS — R260 Ataxic gait: Secondary | ICD-10-CM | POA: Diagnosis not present

## 2017-07-26 DIAGNOSIS — R262 Difficulty in walking, not elsewhere classified: Secondary | ICD-10-CM | POA: Diagnosis not present

## 2017-07-26 DIAGNOSIS — R42 Dizziness and giddiness: Secondary | ICD-10-CM | POA: Diagnosis not present

## 2017-07-26 DIAGNOSIS — M6281 Muscle weakness (generalized): Secondary | ICD-10-CM | POA: Diagnosis not present

## 2017-08-05 DIAGNOSIS — M6281 Muscle weakness (generalized): Secondary | ICD-10-CM | POA: Diagnosis not present

## 2017-08-05 DIAGNOSIS — R262 Difficulty in walking, not elsewhere classified: Secondary | ICD-10-CM | POA: Diagnosis not present

## 2017-08-05 DIAGNOSIS — R260 Ataxic gait: Secondary | ICD-10-CM | POA: Diagnosis not present

## 2017-08-05 DIAGNOSIS — R42 Dizziness and giddiness: Secondary | ICD-10-CM | POA: Diagnosis not present

## 2017-08-09 DIAGNOSIS — R42 Dizziness and giddiness: Secondary | ICD-10-CM | POA: Diagnosis not present

## 2017-08-09 DIAGNOSIS — R260 Ataxic gait: Secondary | ICD-10-CM | POA: Diagnosis not present

## 2017-08-09 DIAGNOSIS — R262 Difficulty in walking, not elsewhere classified: Secondary | ICD-10-CM | POA: Diagnosis not present

## 2017-08-09 DIAGNOSIS — M6281 Muscle weakness (generalized): Secondary | ICD-10-CM | POA: Diagnosis not present

## 2017-08-12 DIAGNOSIS — R262 Difficulty in walking, not elsewhere classified: Secondary | ICD-10-CM | POA: Diagnosis not present

## 2017-08-12 DIAGNOSIS — M6281 Muscle weakness (generalized): Secondary | ICD-10-CM | POA: Diagnosis not present

## 2017-08-12 DIAGNOSIS — R42 Dizziness and giddiness: Secondary | ICD-10-CM | POA: Diagnosis not present

## 2017-08-12 DIAGNOSIS — R260 Ataxic gait: Secondary | ICD-10-CM | POA: Diagnosis not present

## 2017-09-09 DIAGNOSIS — Z23 Encounter for immunization: Secondary | ICD-10-CM | POA: Diagnosis not present

## 2017-10-21 DIAGNOSIS — R2689 Other abnormalities of gait and mobility: Secondary | ICD-10-CM | POA: Diagnosis not present

## 2017-10-21 DIAGNOSIS — D692 Other nonthrombocytopenic purpura: Secondary | ICD-10-CM | POA: Diagnosis not present

## 2017-10-21 DIAGNOSIS — E7800 Pure hypercholesterolemia, unspecified: Secondary | ICD-10-CM | POA: Diagnosis not present

## 2017-10-21 DIAGNOSIS — E1129 Type 2 diabetes mellitus with other diabetic kidney complication: Secondary | ICD-10-CM | POA: Diagnosis not present

## 2017-10-21 DIAGNOSIS — L821 Other seborrheic keratosis: Secondary | ICD-10-CM | POA: Diagnosis not present

## 2017-10-21 DIAGNOSIS — E1142 Type 2 diabetes mellitus with diabetic polyneuropathy: Secondary | ICD-10-CM | POA: Diagnosis not present

## 2017-10-21 DIAGNOSIS — R809 Proteinuria, unspecified: Secondary | ICD-10-CM | POA: Diagnosis not present

## 2017-10-21 DIAGNOSIS — J309 Allergic rhinitis, unspecified: Secondary | ICD-10-CM | POA: Diagnosis not present

## 2017-10-21 DIAGNOSIS — R42 Dizziness and giddiness: Secondary | ICD-10-CM | POA: Diagnosis not present

## 2017-10-21 DIAGNOSIS — H409 Unspecified glaucoma: Secondary | ICD-10-CM | POA: Diagnosis not present

## 2017-10-21 DIAGNOSIS — I10 Essential (primary) hypertension: Secondary | ICD-10-CM | POA: Diagnosis not present

## 2017-12-03 DIAGNOSIS — E113291 Type 2 diabetes mellitus with mild nonproliferative diabetic retinopathy without macular edema, right eye: Secondary | ICD-10-CM | POA: Diagnosis not present

## 2017-12-03 DIAGNOSIS — H401132 Primary open-angle glaucoma, bilateral, moderate stage: Secondary | ICD-10-CM | POA: Diagnosis not present

## 2017-12-03 DIAGNOSIS — H353131 Nonexudative age-related macular degeneration, bilateral, early dry stage: Secondary | ICD-10-CM | POA: Diagnosis not present

## 2017-12-03 DIAGNOSIS — D3131 Benign neoplasm of right choroid: Secondary | ICD-10-CM | POA: Diagnosis not present

## 2017-12-14 DIAGNOSIS — I10 Essential (primary) hypertension: Secondary | ICD-10-CM | POA: Diagnosis not present

## 2017-12-21 ENCOUNTER — Other Ambulatory Visit: Payer: Self-pay | Admitting: Family Medicine

## 2017-12-21 DIAGNOSIS — R42 Dizziness and giddiness: Secondary | ICD-10-CM | POA: Diagnosis not present

## 2017-12-21 DIAGNOSIS — I10 Essential (primary) hypertension: Secondary | ICD-10-CM | POA: Diagnosis not present

## 2017-12-25 ENCOUNTER — Ambulatory Visit
Admission: RE | Admit: 2017-12-25 | Discharge: 2017-12-25 | Disposition: A | Discharge status: Home / Self Care | Payer: Medicare Other | Source: Ambulatory Visit | Attending: Family Medicine | Admitting: Family Medicine

## 2017-12-25 DIAGNOSIS — R42 Dizziness and giddiness: Secondary | ICD-10-CM

## 2018-01-26 DIAGNOSIS — I10 Essential (primary) hypertension: Secondary | ICD-10-CM | POA: Diagnosis not present

## 2018-02-01 ENCOUNTER — Ambulatory Visit: Payer: Medicare Other | Attending: Family Medicine | Admitting: Physical Therapy

## 2018-02-01 ENCOUNTER — Other Ambulatory Visit: Payer: Self-pay

## 2018-02-01 ENCOUNTER — Encounter: Payer: Self-pay | Admitting: Physical Therapy

## 2018-02-01 DIAGNOSIS — R2681 Unsteadiness on feet: Secondary | ICD-10-CM | POA: Diagnosis not present

## 2018-02-01 DIAGNOSIS — R42 Dizziness and giddiness: Secondary | ICD-10-CM | POA: Insufficient documentation

## 2018-02-02 NOTE — Patient Instructions (Signed)
Instructed pt to perform ankle pumps in sitting prior to standing up; then perform marching in place in standing prior to taking a step; also instructed pt to sit on side of bed for few minutes before standing up, which she states she is currently doing

## 2018-02-02 NOTE — Therapy (Signed)
[facility] [address] [place], [place], [address] Phone: [phone]   Fax:  [phone]  Physical Therapy Evaluation  Patient Details  Name: Donna Sanford MRN: [id number] Date of Birth: 1925/04/05 Referring Provider: Dr. Gaynelle Arabian   Encounter Date: 02/01/2018  PT End of Session - 02/02/18 1706    Visit Number  1    Number of Visits  1    Authorization Type  [employer] Medicare    Authorization Time Period  02-01-18 - 04-02-18    PT Start Time  1450    PT Stop Time  1535    PT Time Calculation (min)  45 min       Past Medical History:  Diagnosis Date  . Diabetes mellitus without complication ([place])   . GERD (gastroesophageal reflux disease)   . Glaucoma   . Hyperlipidemia   . Hypertension   . Macular degeneration     History reviewed. No pertinent surgical history.  There were no vitals filed for this visit.   Subjective Assessment - 02/02/18 1641    Subjective  Pt reports dizziness is constant but varies in intensity on daily basis:  rates it 8/10 at present time; pt is accompanied by her daughter who reports Dr. Marisue Humble wanted to see if anything else could possibly be done about pt's vertigo - she reports he has said that it is probably a mismatch of the brain getting mixed information    Patient is accompained by:  Family member daughter    Pertinent History  Macular degeneration; glaucoma starting; neuropathy starting in feet; hypertensive episode occurred in Jan. 2019     Patient Stated Goals  resolve the vertigo if possible    Currently in Pain?  No/denies         [facility] PT Assessment - 02/02/18 0001      Assessment   Medical Diagnosis  Vertigo: Imbalance    Referring Provider  Dr. Gaynelle Arabian    Onset Date/Surgical Date  -- Jan. 2019      Precautions   Precautions  Fall      Balance Screen   Has the patient fallen in the past 6 months  No    Has the patient had a decrease in activity  level because of a fear of falling?   No    Is the patient reluctant to leave their home because of a fear of falling?   No pt is always accompanied by someone      Transport planner  Private residence    Home Access  Stairs to enter    Entrance Stairs-Number of Steps  2    [place]  One level      Prior Function   Level of Independence  Independent with household mobility without device;Independent with basic ADLs;Independent with transfers    Vocation  Retired      [employer]   Overall Cognitive Status  Within Functional Limits for tasks assessed      Ambulation/Gait   Ambulation/Gait  Yes    Ambulation/Gait Assistance  6: Modified independent (Device/Increase time)    Ambulation Distance (Feet)  100 Feet    Assistive device  4-wheeled walker    Gait Pattern  Within Functional Limits    Ambulation Surface  Level;Indoor    Gait Comments  pt has 2 RW's - one she uses inside her home, and a 3-wheeled she uses in community  with someone's assistance due to her visual deficits due to macular degeneration         Vestibular Assessment - 02/02/18 0001      Vestibular Assessment   General Observation  Pt is a 84 yr old lady with c/o vertigo which she states started in Jan. 2019 after she had a hypertensive episode.  Pt is accompanied to PT by her daughter.  Pt reports dizziness is constant and states that she has just gotten used to it       Symptom Behavior   Type of Dizziness  Unsteady with head/body turns    Frequency of Dizziness  constant    Duration of Dizziness  constant but intensity varies    Aggravating Factors  Turning head quickly    Relieving Factors  Head stationary      Occulomotor Exam   Occulomotor Alignment  Normal    Spontaneous  Absent    Smooth Pursuits  Intact    Saccades  Intact      Positional Testing   Sidelying Test  Sidelying Right;Sidelying Left      Sidelying Right   Sidelying Right Duration   no increased dizziness reported from baseline    Sidelying Right Symptoms  No nystagmus      Sidelying Left   Sidelying Left Duration  no increased dizziness reported from baseline    Sidelying Left Symptoms  No nystagmus      Positional Sensitivities   Supine to Left Side  No dizziness    Supine to Right Side  No dizziness    Supine to Sitting  Lightheadedness    Positional Sensitivities Comments  Pt reported increased dizziness/ light-headedness with sidelying to sitting and with sit to stand         Objective measurements completed on examination: See above findings.              PT Education - 02/02/18 1705    Education provided  Yes    Education Details  techniques to reduce dizziness with transitional movements (symptoms consistent with orthostatic hypotension)    Person(s) Educated  Patient;Child(ren)    Methods  Explanation;Demonstration    Comprehension  Verbalized understanding;Returned demonstration        Pt was instructed to do ankle pumps with supine to sit transfer, prior to standing up; then to perform marching in place with  Use of RW for assistance with balance, prior to taking a step for walking;  Pt and daughter verbalized understanding            Plan - 02/02/18 1708    Clinical Impression Statement  Pt is a 84 year old lady with c/o constant dizziness which appears to be multi-factorial in etiology.  No nystagmus was observed during any positional testing and pt denied any room spinning vertigo.  Pt did report increased dizziness or light-headedness with supine or sidelying to sitting and with sit to stand, indicaitve of possible orthostatic hypotension.  Pt presents with decreased standing balance due to neuropathy and is currently using a RW at all times for assistance with ambulation.      History and Personal Factors relevant to plan of care:  Hypertensive episode in Jan. 2019 with residual constant dizziness:  macular degeneration, DM     Clinical Presentation  Evolving    Clinical Presentation due to:  neuropathy, vertigo    Clinical Decision Making  Moderate    Clinical Impairments Affecting Rehab Potential  transportation to clinic is limited  due to daughter working full-time as Pharmacist, hospital; pt does not drive    PT Frequency  One time visit per pt's request - due to lack of transportation    PT Treatment/Interventions  ADLs/Self Care Home Management;Therapeutic activities;Therapeutic exercise;Balance training;Neuromuscular re-education;Patient/family education;Gait training    PT Next Visit Plan  N/A - eval only per pt and daughter's request; pt has very limited transportation to clinic with her daugher teaching school; potential is limited due to vertigo appearing to be multi-facotrial in etiology; pt is to do HEP from previous PT (Linton Rump, PT)     Consulted and Agree with Plan of Care  Patient;Family member/caregiver    Family Member Consulted  daughter, Ginger       Patient will benefit from skilled therapeutic intervention in order to improve the following deficits and impairments:  Decreased balance, Impaired vision/preception, Abnormal gait, Dizziness  Visit Diagnosis: Dizziness and giddiness - Plan: PT plan of care cert/re-cert  Unsteadiness on feet - Plan: PT plan of care cert/re-cert     Problem List Patient Active Problem List   Diagnosis Date Noted  . Hyperlipidemia   . Hypertension     Mumin Denomme, [place], PT 02/02/2018, 5:23 PM  [facility] [address] [place], [place], [address] Phone: [phone]   Fax:  [phone]  Name: Donna Sanford MRN: [id number] Date of Birth: Jul 01, 1925

## 2018-02-24 ENCOUNTER — Ambulatory Visit: Payer: Medicare Other | Admitting: Physical Therapy

## 2018-03-03 ENCOUNTER — Encounter: Payer: Medicare Other | Admitting: Physical Therapy

## 2018-03-14 ENCOUNTER — Encounter: Payer: Medicare Other | Admitting: Physical Therapy

## 2018-03-21 ENCOUNTER — Encounter: Payer: Medicare Other | Admitting: Physical Therapy

## 2018-04-18 DIAGNOSIS — E1129 Type 2 diabetes mellitus with other diabetic kidney complication: Secondary | ICD-10-CM | POA: Diagnosis not present

## 2018-04-18 DIAGNOSIS — E7800 Pure hypercholesterolemia, unspecified: Secondary | ICD-10-CM | POA: Diagnosis not present

## 2018-04-18 DIAGNOSIS — Z7984 Long term (current) use of oral hypoglycemic drugs: Secondary | ICD-10-CM | POA: Diagnosis not present

## 2018-04-19 DIAGNOSIS — E1129 Type 2 diabetes mellitus with other diabetic kidney complication: Secondary | ICD-10-CM | POA: Diagnosis not present

## 2018-04-19 DIAGNOSIS — Z7984 Long term (current) use of oral hypoglycemic drugs: Secondary | ICD-10-CM | POA: Diagnosis not present

## 2018-04-29 DIAGNOSIS — E1142 Type 2 diabetes mellitus with diabetic polyneuropathy: Secondary | ICD-10-CM | POA: Diagnosis not present

## 2018-04-29 DIAGNOSIS — E7800 Pure hypercholesterolemia, unspecified: Secondary | ICD-10-CM | POA: Diagnosis not present

## 2018-04-29 DIAGNOSIS — L821 Other seborrheic keratosis: Secondary | ICD-10-CM | POA: Diagnosis not present

## 2018-04-29 DIAGNOSIS — H409 Unspecified glaucoma: Secondary | ICD-10-CM | POA: Diagnosis not present

## 2018-04-29 DIAGNOSIS — I10 Essential (primary) hypertension: Secondary | ICD-10-CM | POA: Diagnosis not present

## 2018-04-29 DIAGNOSIS — R2689 Other abnormalities of gait and mobility: Secondary | ICD-10-CM | POA: Diagnosis not present

## 2018-04-29 DIAGNOSIS — Z7984 Long term (current) use of oral hypoglycemic drugs: Secondary | ICD-10-CM | POA: Diagnosis not present

## 2018-04-29 DIAGNOSIS — R42 Dizziness and giddiness: Secondary | ICD-10-CM | POA: Diagnosis not present

## 2018-04-29 DIAGNOSIS — R809 Proteinuria, unspecified: Secondary | ICD-10-CM | POA: Diagnosis not present

## 2018-04-29 DIAGNOSIS — E1129 Type 2 diabetes mellitus with other diabetic kidney complication: Secondary | ICD-10-CM | POA: Diagnosis not present

## 2018-04-29 DIAGNOSIS — D692 Other nonthrombocytopenic purpura: Secondary | ICD-10-CM | POA: Diagnosis not present

## 2018-04-29 DIAGNOSIS — J309 Allergic rhinitis, unspecified: Secondary | ICD-10-CM | POA: Diagnosis not present

## 2018-06-02 DIAGNOSIS — H401111 Primary open-angle glaucoma, right eye, mild stage: Secondary | ICD-10-CM | POA: Diagnosis not present

## 2018-06-02 DIAGNOSIS — Z961 Presence of intraocular lens: Secondary | ICD-10-CM | POA: Diagnosis not present

## 2018-06-02 DIAGNOSIS — H353131 Nonexudative age-related macular degeneration, bilateral, early dry stage: Secondary | ICD-10-CM | POA: Diagnosis not present

## 2018-06-02 DIAGNOSIS — H401123 Primary open-angle glaucoma, left eye, severe stage: Secondary | ICD-10-CM | POA: Diagnosis not present

## 2018-07-08 DIAGNOSIS — H401132 Primary open-angle glaucoma, bilateral, moderate stage: Secondary | ICD-10-CM | POA: Diagnosis not present

## 2018-08-05 DIAGNOSIS — H04123 Dry eye syndrome of bilateral lacrimal glands: Secondary | ICD-10-CM | POA: Diagnosis not present

## 2018-08-05 DIAGNOSIS — H401132 Primary open-angle glaucoma, bilateral, moderate stage: Secondary | ICD-10-CM | POA: Diagnosis not present

## 2018-08-05 DIAGNOSIS — Z961 Presence of intraocular lens: Secondary | ICD-10-CM | POA: Diagnosis not present

## 2018-08-18 DIAGNOSIS — Z23 Encounter for immunization: Secondary | ICD-10-CM | POA: Diagnosis not present

## 2018-10-27 DIAGNOSIS — E1129 Type 2 diabetes mellitus with other diabetic kidney complication: Secondary | ICD-10-CM | POA: Diagnosis not present

## 2018-10-27 DIAGNOSIS — E7800 Pure hypercholesterolemia, unspecified: Secondary | ICD-10-CM | POA: Diagnosis not present

## 2018-10-31 DIAGNOSIS — H409 Unspecified glaucoma: Secondary | ICD-10-CM | POA: Diagnosis not present

## 2018-10-31 DIAGNOSIS — R42 Dizziness and giddiness: Secondary | ICD-10-CM | POA: Diagnosis not present

## 2018-10-31 DIAGNOSIS — E7800 Pure hypercholesterolemia, unspecified: Secondary | ICD-10-CM | POA: Diagnosis not present

## 2018-10-31 DIAGNOSIS — D692 Other nonthrombocytopenic purpura: Secondary | ICD-10-CM | POA: Diagnosis not present

## 2018-10-31 DIAGNOSIS — I10 Essential (primary) hypertension: Secondary | ICD-10-CM | POA: Diagnosis not present

## 2018-10-31 DIAGNOSIS — E1142 Type 2 diabetes mellitus with diabetic polyneuropathy: Secondary | ICD-10-CM | POA: Diagnosis not present

## 2018-10-31 DIAGNOSIS — J309 Allergic rhinitis, unspecified: Secondary | ICD-10-CM | POA: Diagnosis not present

## 2018-10-31 DIAGNOSIS — R809 Proteinuria, unspecified: Secondary | ICD-10-CM | POA: Diagnosis not present

## 2018-10-31 DIAGNOSIS — E1129 Type 2 diabetes mellitus with other diabetic kidney complication: Secondary | ICD-10-CM | POA: Diagnosis not present

## 2018-10-31 DIAGNOSIS — L821 Other seborrheic keratosis: Secondary | ICD-10-CM | POA: Diagnosis not present

## 2018-10-31 DIAGNOSIS — Z7984 Long term (current) use of oral hypoglycemic drugs: Secondary | ICD-10-CM | POA: Diagnosis not present

## 2018-10-31 DIAGNOSIS — R2689 Other abnormalities of gait and mobility: Secondary | ICD-10-CM | POA: Diagnosis not present

## 2018-12-07 DIAGNOSIS — H04123 Dry eye syndrome of bilateral lacrimal glands: Secondary | ICD-10-CM | POA: Diagnosis not present

## 2018-12-07 DIAGNOSIS — H401111 Primary open-angle glaucoma, right eye, mild stage: Secondary | ICD-10-CM | POA: Diagnosis not present

## 2018-12-07 DIAGNOSIS — H401123 Primary open-angle glaucoma, left eye, severe stage: Secondary | ICD-10-CM | POA: Diagnosis not present

## 2019-01-11 DIAGNOSIS — Z961 Presence of intraocular lens: Secondary | ICD-10-CM | POA: Diagnosis not present

## 2019-01-11 DIAGNOSIS — H4720 Unspecified optic atrophy: Secondary | ICD-10-CM | POA: Diagnosis not present

## 2019-01-11 DIAGNOSIS — H401111 Primary open-angle glaucoma, right eye, mild stage: Secondary | ICD-10-CM | POA: Diagnosis not present

## 2019-01-11 DIAGNOSIS — H401123 Primary open-angle glaucoma, left eye, severe stage: Secondary | ICD-10-CM | POA: Diagnosis not present

## 2019-04-06 DIAGNOSIS — E1129 Type 2 diabetes mellitus with other diabetic kidney complication: Secondary | ICD-10-CM | POA: Diagnosis not present

## 2019-04-06 DIAGNOSIS — M810 Age-related osteoporosis without current pathological fracture: Secondary | ICD-10-CM | POA: Diagnosis not present

## 2019-04-06 DIAGNOSIS — E1142 Type 2 diabetes mellitus with diabetic polyneuropathy: Secondary | ICD-10-CM | POA: Diagnosis not present

## 2019-04-06 DIAGNOSIS — H409 Unspecified glaucoma: Secondary | ICD-10-CM | POA: Diagnosis not present

## 2019-04-06 DIAGNOSIS — I10 Essential (primary) hypertension: Secondary | ICD-10-CM | POA: Diagnosis not present

## 2019-04-20 DIAGNOSIS — H04123 Dry eye syndrome of bilateral lacrimal glands: Secondary | ICD-10-CM | POA: Diagnosis not present

## 2019-04-20 DIAGNOSIS — H401132 Primary open-angle glaucoma, bilateral, moderate stage: Secondary | ICD-10-CM | POA: Diagnosis not present

## 2019-04-20 DIAGNOSIS — Z961 Presence of intraocular lens: Secondary | ICD-10-CM | POA: Diagnosis not present

## 2019-04-20 DIAGNOSIS — H353131 Nonexudative age-related macular degeneration, bilateral, early dry stage: Secondary | ICD-10-CM | POA: Diagnosis not present

## 2019-05-01 DIAGNOSIS — I10 Essential (primary) hypertension: Secondary | ICD-10-CM | POA: Diagnosis not present

## 2019-05-01 DIAGNOSIS — E7800 Pure hypercholesterolemia, unspecified: Secondary | ICD-10-CM | POA: Diagnosis not present

## 2019-05-01 DIAGNOSIS — E1129 Type 2 diabetes mellitus with other diabetic kidney complication: Secondary | ICD-10-CM | POA: Diagnosis not present

## 2019-05-02 DIAGNOSIS — I10 Essential (primary) hypertension: Secondary | ICD-10-CM | POA: Diagnosis not present

## 2019-05-03 DIAGNOSIS — Z0000 Encounter for general adult medical examination without abnormal findings: Secondary | ICD-10-CM | POA: Diagnosis not present

## 2019-05-03 DIAGNOSIS — E1129 Type 2 diabetes mellitus with other diabetic kidney complication: Secondary | ICD-10-CM | POA: Diagnosis not present

## 2019-05-03 DIAGNOSIS — E7800 Pure hypercholesterolemia, unspecified: Secondary | ICD-10-CM | POA: Diagnosis not present

## 2019-05-03 DIAGNOSIS — H409 Unspecified glaucoma: Secondary | ICD-10-CM | POA: Diagnosis not present

## 2019-05-03 DIAGNOSIS — I10 Essential (primary) hypertension: Secondary | ICD-10-CM | POA: Diagnosis not present

## 2019-05-11 DIAGNOSIS — M810 Age-related osteoporosis without current pathological fracture: Secondary | ICD-10-CM | POA: Diagnosis not present

## 2019-05-11 DIAGNOSIS — I10 Essential (primary) hypertension: Secondary | ICD-10-CM | POA: Diagnosis not present

## 2019-05-11 DIAGNOSIS — H409 Unspecified glaucoma: Secondary | ICD-10-CM | POA: Diagnosis not present

## 2019-05-11 DIAGNOSIS — E1142 Type 2 diabetes mellitus with diabetic polyneuropathy: Secondary | ICD-10-CM | POA: Diagnosis not present

## 2019-05-11 DIAGNOSIS — E1129 Type 2 diabetes mellitus with other diabetic kidney complication: Secondary | ICD-10-CM | POA: Diagnosis not present

## 2019-06-05 DIAGNOSIS — M810 Age-related osteoporosis without current pathological fracture: Secondary | ICD-10-CM | POA: Diagnosis not present

## 2019-06-05 DIAGNOSIS — H409 Unspecified glaucoma: Secondary | ICD-10-CM | POA: Diagnosis not present

## 2019-06-05 DIAGNOSIS — I10 Essential (primary) hypertension: Secondary | ICD-10-CM | POA: Diagnosis not present

## 2019-06-05 DIAGNOSIS — E1142 Type 2 diabetes mellitus with diabetic polyneuropathy: Secondary | ICD-10-CM | POA: Diagnosis not present

## 2019-06-05 DIAGNOSIS — E1129 Type 2 diabetes mellitus with other diabetic kidney complication: Secondary | ICD-10-CM | POA: Diagnosis not present

## 2019-06-17 IMAGING — MR MR HEAD W/O CM
10 series · 48 of 48 positions shown · non-contrast
Comparison: 09/28/2011 CT head

CLINICAL DATA: [AGE]/o F; 2 weeks of worsening dizziness and
confusion with frontal headache.

EXAM:
MRI HEAD WITHOUT CONTRAST
TECHNIQUE: Multiplanar, multiecho pulse sequences of the brain and surrounding
structures were obtained without intravenous contrast.

[Series 2: T1 · sagittal · 5.0mm · 0.45mm/px · 3 of 21 slices shown]
[im 1/21]
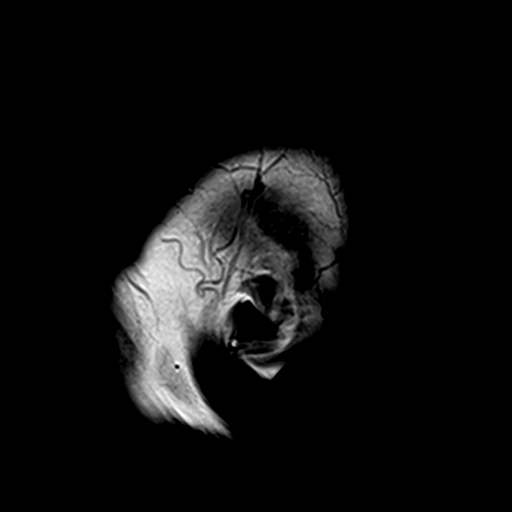
[im 11/21]
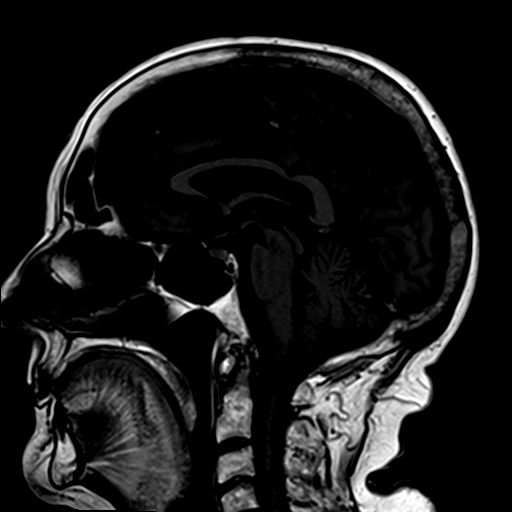
[im 21/21]
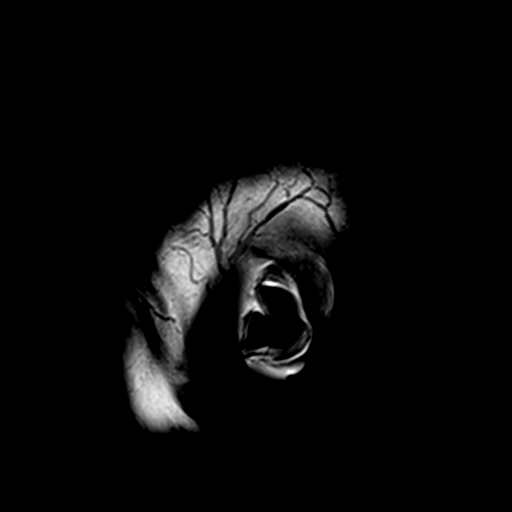

[Series 3: T2 · coronal · 5.0mm · 0.45mm/px · 2 of 25 slices shown (1 of 2)]
[im 1/25]
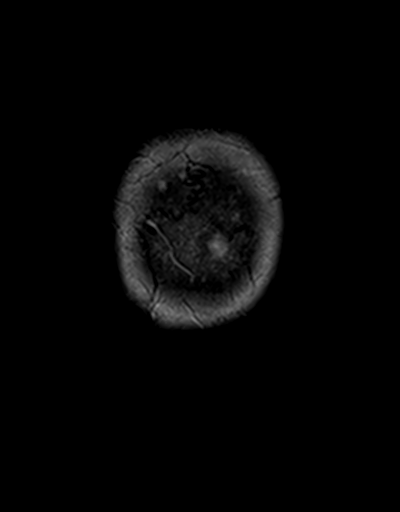
[im 25/25]
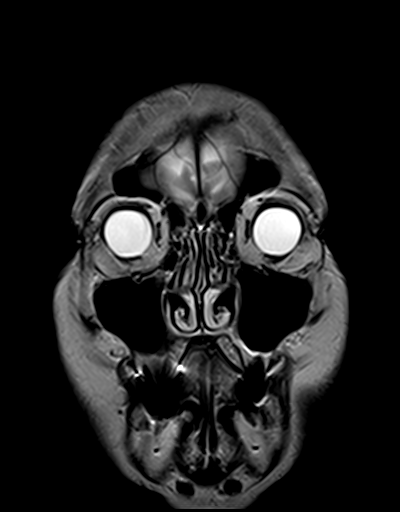

[Series 4: DWI · coronal · 5.0mm · 1.80mm/px · 5 of 50 slices shown (1 of 4)]
[im 1/50]
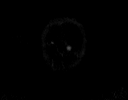
[im 13/50]
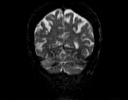
[im 25/50]
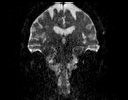
[im 37/50]
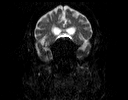
[im 50/50]
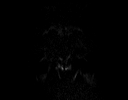

[Series 5: DWI · coronal · 5.0mm · 1.80mm/px · 2 of 25 slices shown (2 of 4)]
[im 1/25]
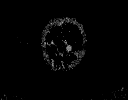
[im 25/25]
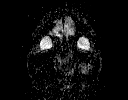

[Series 6: DWI · axial · 3.0mm · 1.80mm/px · z∈[-59,+87]mm · 9 of 98 slices shown (3 of 4)]
[im 1/98]
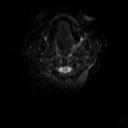
[im 13/98]
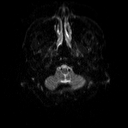
[im 25/98]
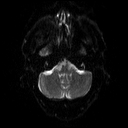
[im 37/98]
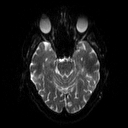
[im 49/98]
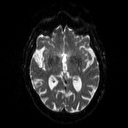
[im 61/98]
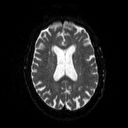
[im 73/98]
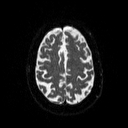
[im 85/98]
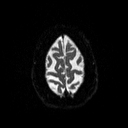
[im 98/98]
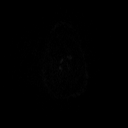

[Series 7: DWI · axial · 3.0mm · 1.80mm/px · z∈[-59,+87]mm · 5 of 49 slices shown (4 of 4)]
[im 1/49]
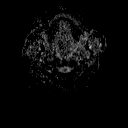
[im 13/49]
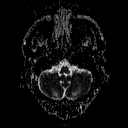
[im 25/49]
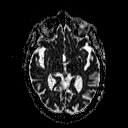
[im 37/49]
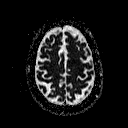
[im 49/49]
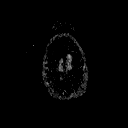

[Series 8: T2 · axial · 5.0mm · 0.51mm/px · z∈[-60,+87]mm · 2 of 22 slices shown (2 of 2)]
[im 1/22]
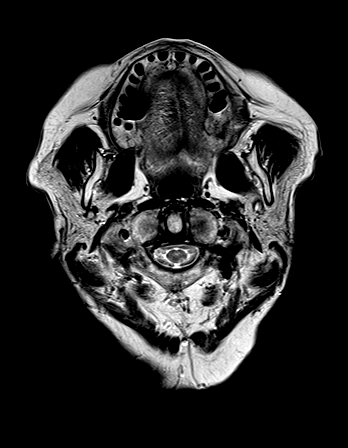
[im 22/22]
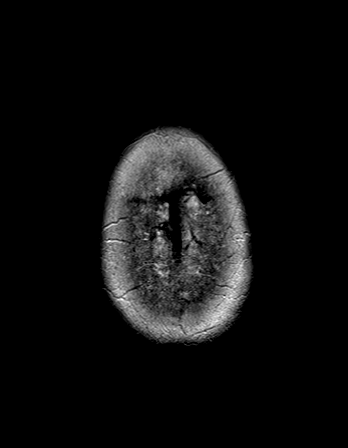

[Series 9: FLAIR · axial · 3.0mm · 0.45mm/px · z∈[-57,+86]mm · 3 of 32 slices shown]
[im 1/32]
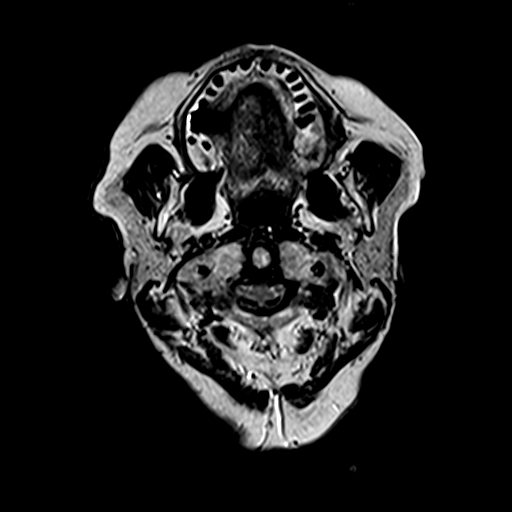
[im 16/32]
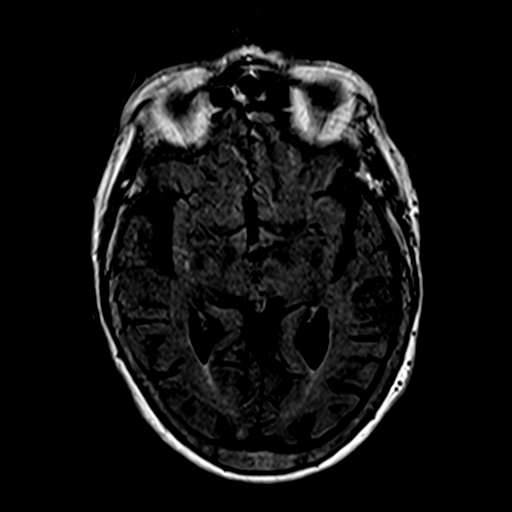
[im 32/32]
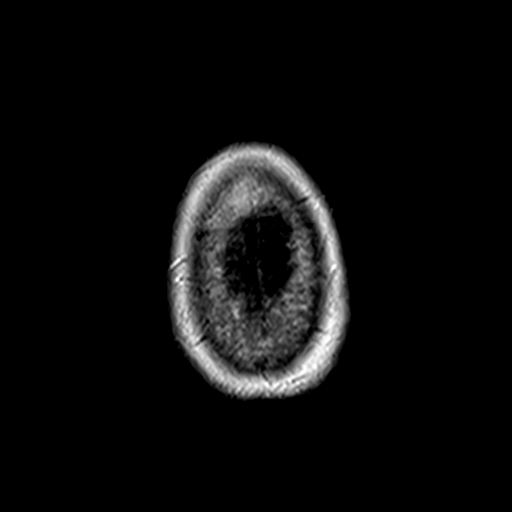

[Series 11: swi_images · axial · 5.0mm · 0.90mm/px · z∈[-58,+86]mm · 3 of 30 slices shown]
[im 1/30]
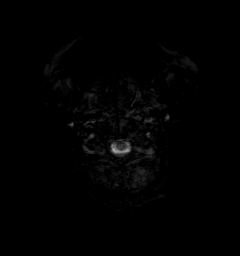
[im 15/30]
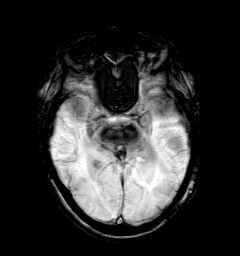
[im 30/30]
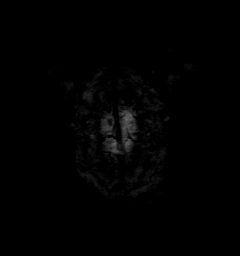

[Series 12: t1_mpr_tra · axial · 1.0mm · 0.71mm/px · z∈[-58,+84]mm · 14 of 144 slices shown]
[im 1/144]
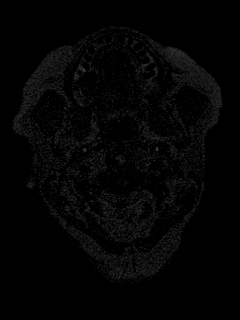
[im 12/144]
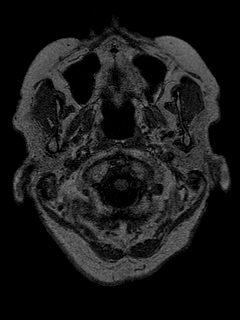
[im 23/144]
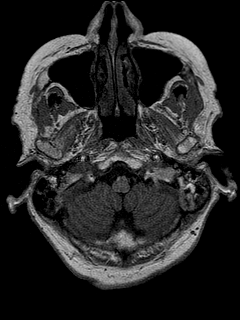
[im 34/144]
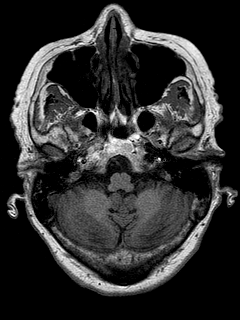
[im 45/144]
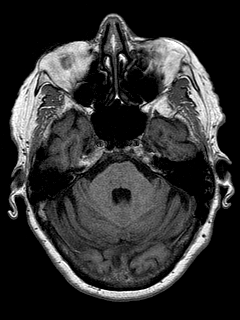
[im 56/144]
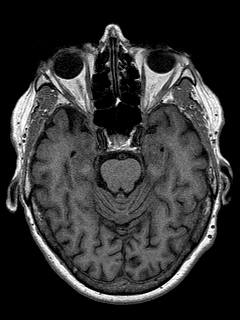
[im 67/144]
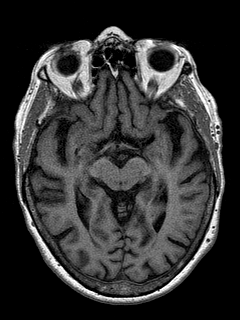
[im 78/144]
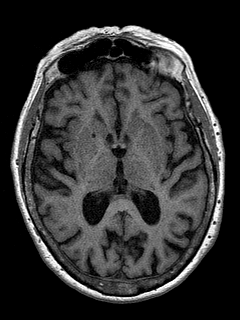
[im 89/144]
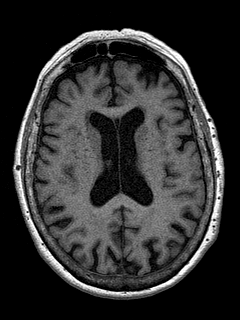
[im 100/144]
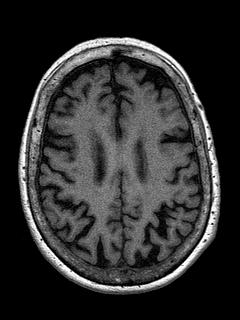
[im 111/144]
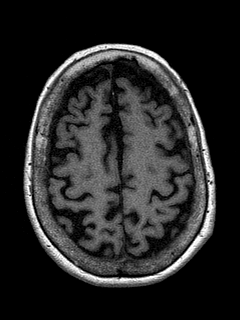
[im 122/144]
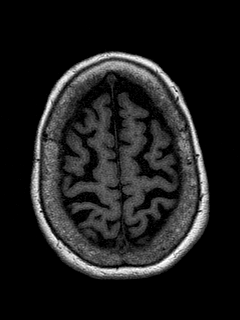
[im 133/144]
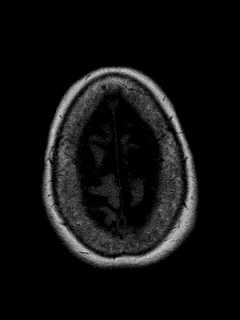
[im 144/144]
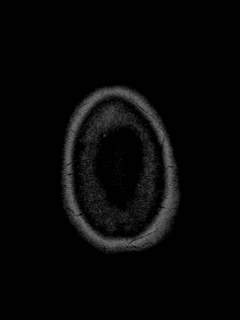

[48 of 48 positions shown; findings below may reference images not displayed]

FINDINGS: Brain: No acute infarction, hemorrhage, hydrocephalus, extra-axial
collection or mass lesion. Severalnonspecific foci of T2 FLAIR
hyperintense signal abnormality in subcortical and periventricular
white matter are compatible withmildchronic microvascular ischemic
changes for age. Mildbrain parenchymal volume loss. Punctate foci of
susceptibility hypointensity within the cerebellar hemispheres
compatible with hemosiderin deposition of chronic microhemorrhage.

Vascular: Normal flow voids.

Skull and upper cervical spine: Normal marrow signal.

Sinuses/Orbits: Mild paranasal sinus mucosal thickening and partial
opacification of the right mastoid air cells. Bilateral intra-ocular
lens replacement.

Other: None.
IMPRESSION: 1. No acute intracranial abnormality.
2. Mild for age chronic microvascular ischemic changes and mild
parenchymal volume loss of the brain.
3. Mild paranasal sinus disease and partial right mastoid
opacification.

By: Jullon Lienad M.D.

## 2019-06-29 DIAGNOSIS — I10 Essential (primary) hypertension: Secondary | ICD-10-CM | POA: Diagnosis not present

## 2019-06-29 DIAGNOSIS — E1129 Type 2 diabetes mellitus with other diabetic kidney complication: Secondary | ICD-10-CM | POA: Diagnosis not present

## 2019-06-29 DIAGNOSIS — E1142 Type 2 diabetes mellitus with diabetic polyneuropathy: Secondary | ICD-10-CM | POA: Diagnosis not present

## 2019-06-29 DIAGNOSIS — M810 Age-related osteoporosis without current pathological fracture: Secondary | ICD-10-CM | POA: Diagnosis not present

## 2019-06-29 DIAGNOSIS — H409 Unspecified glaucoma: Secondary | ICD-10-CM | POA: Diagnosis not present

## 2019-08-09 DIAGNOSIS — E1142 Type 2 diabetes mellitus with diabetic polyneuropathy: Secondary | ICD-10-CM | POA: Diagnosis not present

## 2019-08-09 DIAGNOSIS — M810 Age-related osteoporosis without current pathological fracture: Secondary | ICD-10-CM | POA: Diagnosis not present

## 2019-08-09 DIAGNOSIS — E1129 Type 2 diabetes mellitus with other diabetic kidney complication: Secondary | ICD-10-CM | POA: Diagnosis not present

## 2019-08-09 DIAGNOSIS — H409 Unspecified glaucoma: Secondary | ICD-10-CM | POA: Diagnosis not present

## 2019-08-09 DIAGNOSIS — I10 Essential (primary) hypertension: Secondary | ICD-10-CM | POA: Diagnosis not present

## 2019-09-14 DIAGNOSIS — I10 Essential (primary) hypertension: Secondary | ICD-10-CM | POA: Diagnosis not present

## 2019-09-14 DIAGNOSIS — M810 Age-related osteoporosis without current pathological fracture: Secondary | ICD-10-CM | POA: Diagnosis not present

## 2019-09-14 DIAGNOSIS — E1129 Type 2 diabetes mellitus with other diabetic kidney complication: Secondary | ICD-10-CM | POA: Diagnosis not present

## 2019-09-14 DIAGNOSIS — E7800 Pure hypercholesterolemia, unspecified: Secondary | ICD-10-CM | POA: Diagnosis not present

## 2019-09-14 DIAGNOSIS — E1142 Type 2 diabetes mellitus with diabetic polyneuropathy: Secondary | ICD-10-CM | POA: Diagnosis not present

## 2019-09-14 DIAGNOSIS — H409 Unspecified glaucoma: Secondary | ICD-10-CM | POA: Diagnosis not present

## 2019-09-21 DIAGNOSIS — H401132 Primary open-angle glaucoma, bilateral, moderate stage: Secondary | ICD-10-CM | POA: Diagnosis not present

## 2019-09-21 DIAGNOSIS — Z961 Presence of intraocular lens: Secondary | ICD-10-CM | POA: Diagnosis not present

## 2019-10-03 DIAGNOSIS — H409 Unspecified glaucoma: Secondary | ICD-10-CM | POA: Diagnosis not present

## 2019-10-03 DIAGNOSIS — E1129 Type 2 diabetes mellitus with other diabetic kidney complication: Secondary | ICD-10-CM | POA: Diagnosis not present

## 2019-10-03 DIAGNOSIS — I10 Essential (primary) hypertension: Secondary | ICD-10-CM | POA: Diagnosis not present

## 2019-10-03 DIAGNOSIS — E1142 Type 2 diabetes mellitus with diabetic polyneuropathy: Secondary | ICD-10-CM | POA: Diagnosis not present

## 2019-10-03 DIAGNOSIS — E7800 Pure hypercholesterolemia, unspecified: Secondary | ICD-10-CM | POA: Diagnosis not present

## 2019-10-03 DIAGNOSIS — M810 Age-related osteoporosis without current pathological fracture: Secondary | ICD-10-CM | POA: Diagnosis not present

## 2020-02-01 DIAGNOSIS — E7800 Pure hypercholesterolemia, unspecified: Secondary | ICD-10-CM | POA: Diagnosis not present

## 2020-02-01 DIAGNOSIS — I10 Essential (primary) hypertension: Secondary | ICD-10-CM | POA: Diagnosis not present

## 2020-02-01 DIAGNOSIS — H409 Unspecified glaucoma: Secondary | ICD-10-CM | POA: Diagnosis not present

## 2020-02-01 DIAGNOSIS — E1142 Type 2 diabetes mellitus with diabetic polyneuropathy: Secondary | ICD-10-CM | POA: Diagnosis not present

## 2020-02-01 DIAGNOSIS — Z7984 Long term (current) use of oral hypoglycemic drugs: Secondary | ICD-10-CM | POA: Diagnosis not present

## 2020-02-01 DIAGNOSIS — E1129 Type 2 diabetes mellitus with other diabetic kidney complication: Secondary | ICD-10-CM | POA: Diagnosis not present

## 2020-02-01 DIAGNOSIS — M810 Age-related osteoporosis without current pathological fracture: Secondary | ICD-10-CM | POA: Diagnosis not present

## 2020-03-07 DIAGNOSIS — E1129 Type 2 diabetes mellitus with other diabetic kidney complication: Secondary | ICD-10-CM | POA: Diagnosis not present

## 2020-03-07 DIAGNOSIS — E1142 Type 2 diabetes mellitus with diabetic polyneuropathy: Secondary | ICD-10-CM | POA: Diagnosis not present

## 2020-03-07 DIAGNOSIS — Z7984 Long term (current) use of oral hypoglycemic drugs: Secondary | ICD-10-CM | POA: Diagnosis not present

## 2020-03-07 DIAGNOSIS — I10 Essential (primary) hypertension: Secondary | ICD-10-CM | POA: Diagnosis not present

## 2020-03-07 DIAGNOSIS — M810 Age-related osteoporosis without current pathological fracture: Secondary | ICD-10-CM | POA: Diagnosis not present

## 2020-03-07 DIAGNOSIS — E7800 Pure hypercholesterolemia, unspecified: Secondary | ICD-10-CM | POA: Diagnosis not present

## 2020-03-07 DIAGNOSIS — H409 Unspecified glaucoma: Secondary | ICD-10-CM | POA: Diagnosis not present

## 2020-03-18 DIAGNOSIS — E119 Type 2 diabetes mellitus without complications: Secondary | ICD-10-CM | POA: Diagnosis not present

## 2020-03-18 DIAGNOSIS — H524 Presbyopia: Secondary | ICD-10-CM | POA: Diagnosis not present

## 2020-03-18 DIAGNOSIS — H353131 Nonexudative age-related macular degeneration, bilateral, early dry stage: Secondary | ICD-10-CM | POA: Diagnosis not present

## 2020-03-18 DIAGNOSIS — H401132 Primary open-angle glaucoma, bilateral, moderate stage: Secondary | ICD-10-CM | POA: Diagnosis not present

## 2020-04-26 DIAGNOSIS — E1129 Type 2 diabetes mellitus with other diabetic kidney complication: Secondary | ICD-10-CM | POA: Diagnosis not present

## 2020-04-26 DIAGNOSIS — I10 Essential (primary) hypertension: Secondary | ICD-10-CM | POA: Diagnosis not present

## 2020-04-26 DIAGNOSIS — E7800 Pure hypercholesterolemia, unspecified: Secondary | ICD-10-CM | POA: Diagnosis not present

## 2020-05-05 DIAGNOSIS — M810 Age-related osteoporosis without current pathological fracture: Secondary | ICD-10-CM | POA: Diagnosis not present

## 2020-05-05 DIAGNOSIS — H409 Unspecified glaucoma: Secondary | ICD-10-CM | POA: Diagnosis not present

## 2020-05-05 DIAGNOSIS — E1129 Type 2 diabetes mellitus with other diabetic kidney complication: Secondary | ICD-10-CM | POA: Diagnosis not present

## 2020-05-05 DIAGNOSIS — E7800 Pure hypercholesterolemia, unspecified: Secondary | ICD-10-CM | POA: Diagnosis not present

## 2020-05-05 DIAGNOSIS — E1142 Type 2 diabetes mellitus with diabetic polyneuropathy: Secondary | ICD-10-CM | POA: Diagnosis not present

## 2020-05-05 DIAGNOSIS — I10 Essential (primary) hypertension: Secondary | ICD-10-CM | POA: Diagnosis not present

## 2020-06-14 DIAGNOSIS — E1142 Type 2 diabetes mellitus with diabetic polyneuropathy: Secondary | ICD-10-CM | POA: Diagnosis not present

## 2020-06-14 DIAGNOSIS — H409 Unspecified glaucoma: Secondary | ICD-10-CM | POA: Diagnosis not present

## 2020-06-14 DIAGNOSIS — E1129 Type 2 diabetes mellitus with other diabetic kidney complication: Secondary | ICD-10-CM | POA: Diagnosis not present

## 2020-06-14 DIAGNOSIS — E7800 Pure hypercholesterolemia, unspecified: Secondary | ICD-10-CM | POA: Diagnosis not present

## 2020-06-14 DIAGNOSIS — M810 Age-related osteoporosis without current pathological fracture: Secondary | ICD-10-CM | POA: Diagnosis not present

## 2020-06-14 DIAGNOSIS — I10 Essential (primary) hypertension: Secondary | ICD-10-CM | POA: Diagnosis not present

## 2020-07-31 DIAGNOSIS — I10 Essential (primary) hypertension: Secondary | ICD-10-CM | POA: Diagnosis not present

## 2020-07-31 DIAGNOSIS — E7800 Pure hypercholesterolemia, unspecified: Secondary | ICD-10-CM | POA: Diagnosis not present

## 2020-07-31 DIAGNOSIS — E1142 Type 2 diabetes mellitus with diabetic polyneuropathy: Secondary | ICD-10-CM | POA: Diagnosis not present

## 2020-07-31 DIAGNOSIS — H409 Unspecified glaucoma: Secondary | ICD-10-CM | POA: Diagnosis not present

## 2020-07-31 DIAGNOSIS — E1129 Type 2 diabetes mellitus with other diabetic kidney complication: Secondary | ICD-10-CM | POA: Diagnosis not present

## 2020-07-31 DIAGNOSIS — M810 Age-related osteoporosis without current pathological fracture: Secondary | ICD-10-CM | POA: Diagnosis not present

## 2020-09-09 DIAGNOSIS — Z23 Encounter for immunization: Secondary | ICD-10-CM | POA: Diagnosis not present

## 2020-09-18 DIAGNOSIS — Z961 Presence of intraocular lens: Secondary | ICD-10-CM | POA: Diagnosis not present

## 2020-09-18 DIAGNOSIS — H353131 Nonexudative age-related macular degeneration, bilateral, early dry stage: Secondary | ICD-10-CM | POA: Diagnosis not present

## 2020-09-18 DIAGNOSIS — H401132 Primary open-angle glaucoma, bilateral, moderate stage: Secondary | ICD-10-CM | POA: Diagnosis not present

## 2020-10-07 DIAGNOSIS — E1142 Type 2 diabetes mellitus with diabetic polyneuropathy: Secondary | ICD-10-CM | POA: Diagnosis not present

## 2020-10-07 DIAGNOSIS — M810 Age-related osteoporosis without current pathological fracture: Secondary | ICD-10-CM | POA: Diagnosis not present

## 2020-10-07 DIAGNOSIS — I10 Essential (primary) hypertension: Secondary | ICD-10-CM | POA: Diagnosis not present

## 2020-10-07 DIAGNOSIS — E1129 Type 2 diabetes mellitus with other diabetic kidney complication: Secondary | ICD-10-CM | POA: Diagnosis not present

## 2020-10-07 DIAGNOSIS — E7800 Pure hypercholesterolemia, unspecified: Secondary | ICD-10-CM | POA: Diagnosis not present

## 2020-10-07 DIAGNOSIS — H409 Unspecified glaucoma: Secondary | ICD-10-CM | POA: Diagnosis not present

## 2020-10-30 DIAGNOSIS — E1129 Type 2 diabetes mellitus with other diabetic kidney complication: Secondary | ICD-10-CM | POA: Diagnosis not present

## 2020-10-30 DIAGNOSIS — H409 Unspecified glaucoma: Secondary | ICD-10-CM | POA: Diagnosis not present

## 2020-10-30 DIAGNOSIS — M810 Age-related osteoporosis without current pathological fracture: Secondary | ICD-10-CM | POA: Diagnosis not present

## 2020-10-30 DIAGNOSIS — E1142 Type 2 diabetes mellitus with diabetic polyneuropathy: Secondary | ICD-10-CM | POA: Diagnosis not present

## 2020-10-30 DIAGNOSIS — E7800 Pure hypercholesterolemia, unspecified: Secondary | ICD-10-CM | POA: Diagnosis not present

## 2020-10-30 DIAGNOSIS — I10 Essential (primary) hypertension: Secondary | ICD-10-CM | POA: Diagnosis not present

## 2020-11-14 DIAGNOSIS — E1129 Type 2 diabetes mellitus with other diabetic kidney complication: Secondary | ICD-10-CM | POA: Diagnosis not present

## 2020-11-14 DIAGNOSIS — E7800 Pure hypercholesterolemia, unspecified: Secondary | ICD-10-CM | POA: Diagnosis not present

## 2021-05-23 ENCOUNTER — Ambulatory Visit: Payer: Medicare Other | Admitting: Podiatry

## 2021-05-23 ENCOUNTER — Other Ambulatory Visit: Payer: Self-pay

## 2021-05-23 VITALS — BP 130/72 | HR 67 | Temp 98.2°F

## 2021-05-23 DIAGNOSIS — E1169 Type 2 diabetes mellitus with other specified complication: Secondary | ICD-10-CM

## 2021-05-23 DIAGNOSIS — B351 Tinea unguium: Secondary | ICD-10-CM | POA: Diagnosis not present

## 2021-05-23 DIAGNOSIS — E1142 Type 2 diabetes mellitus with diabetic polyneuropathy: Secondary | ICD-10-CM

## 2021-05-23 NOTE — Progress Notes (Signed)
  Subjective:  Patient ID: Donna Sanford, female    DOB: June 25, 1925,  MRN: [id number]  Chief Complaint  Patient presents with   Nail Problem    Patient reports thick, discolored, painful toenails. Onset about 1 year ago that has progressively gotten worse.    Peripheral Neuropathy    Patient is diabetic, last A1C was a 6.6. Patient endorses numbness, tingling and burning bilaterally.   87 y.o. female presents with the above complaint. History confirmed with patient.   Objective:  Physical Exam: warm, good capillary refill, nail exam onychomycosis of the toenails, no trophic changes or ulcerative lesions. DP pulses palpable, PT pulses palpable, and protective sensation absent Left Foot: normal exam, no swelling, tenderness, instability; ligaments intact, full range of motion of all ankle/foot joints  Right Foot: normal exam, no swelling, tenderness, instability; ligaments intact, full range of motion of all ankle/foot joints   No images are attached to the encounter.  Assessment:   1. Onychomycosis of multiple toenails with type 2 diabetes mellitus and peripheral neuropathy ([place])      Plan:  Patient was evaluated and treated and all questions answered.  Onychomycosis, Diabetes, and DPN -Patient is diabetic with a qualifying condition for at risk foot care.  Procedure: Nail Debridement Type of Debridement: manual, sharp debridement. Instrumentation: Nail nipper, rotary burr. Number of Nails: 10  Return in about 3 months (around 08/23/2021) for Diabetic Foot Care.

## 2021-08-29 ENCOUNTER — Other Ambulatory Visit: Payer: Self-pay

## 2021-08-29 ENCOUNTER — Ambulatory Visit: Payer: Medicare Other | Admitting: Podiatry

## 2021-08-29 DIAGNOSIS — E1169 Type 2 diabetes mellitus with other specified complication: Secondary | ICD-10-CM | POA: Diagnosis not present

## 2021-08-29 DIAGNOSIS — E1142 Type 2 diabetes mellitus with diabetic polyneuropathy: Secondary | ICD-10-CM

## 2021-08-29 DIAGNOSIS — B351 Tinea unguium: Secondary | ICD-10-CM

## 2021-08-29 NOTE — Progress Notes (Signed)
  Subjective:  Patient ID: Donna Sanford, female    DOB: 1925/04/24,  MRN: [id number]  No chief complaint on file.  87 y.o. female presents for nail debridement. Denies new pedal issues.  Objective:  Physical Exam: warm, good capillary refill, nail exam onychomycosis of the toenails, no trophic changes or ulcerative lesions. DP pulses palpable, PT pulses palpable, and protective sensation absent Left Foot: normal exam, no swelling, tenderness, instability; ligaments intact, full range of motion of all ankle/foot joints  Right Foot: normal exam, no swelling, tenderness, instability; ligaments intact, full range of motion of all ankle/foot joints   No images are attached to the encounter.  Assessment:   1. Onychomycosis of multiple toenails with type 2 diabetes mellitus and peripheral neuropathy ([place])    Plan:  Patient was evaluated and treated and all questions answered.  Onychomycosis, Diabetes, and DPN -Patient is diabetic with a qualifying condition for at risk foot care.  Procedure: Nail Debridement Type of Debridement: manual, sharp debridement. Instrumentation: Nail nipper, rotary burr. Number of Nails: 10   Return in about 3 months (around 11/29/2021) for Diabetic Foot Care.

## 2021-12-05 ENCOUNTER — Other Ambulatory Visit: Payer: Self-pay

## 2021-12-05 ENCOUNTER — Ambulatory Visit (INDEPENDENT_AMBULATORY_CARE_PROVIDER_SITE_OTHER): Payer: Medicare Other | Admitting: Podiatry

## 2021-12-05 DIAGNOSIS — E1142 Type 2 diabetes mellitus with diabetic polyneuropathy: Secondary | ICD-10-CM

## 2021-12-05 DIAGNOSIS — E1169 Type 2 diabetes mellitus with other specified complication: Secondary | ICD-10-CM

## 2021-12-05 DIAGNOSIS — B351 Tinea unguium: Secondary | ICD-10-CM

## 2021-12-05 NOTE — Progress Notes (Signed)
°  Subjective:  Patient ID: Donna Sanford, female    DOB: 01/01/1925,  MRN: [id number]  Chief Complaint  Patient presents with   Nail Problem    Patient is here for [facility]   88 y.o. female presents for nail debridement. Denies new pedal issues. Unaware of last AM BS. Last A1c 6.4  Gaynelle Arabian, MD last seen 11/11/21.  Objective:  Physical Exam: warm, good capillary refill, nail exam onychomycosis of the toenails, no trophic changes or ulcerative lesions. DP pulses palpable, PT pulses palpable, and protective sensation absent Left Foot: normal exam, no swelling, tenderness, instability; ligaments intact, full range of motion of all ankle/foot joints  Right Foot: normal exam, no swelling, tenderness, instability; ligaments intact, full range of motion of all ankle/foot joints   No images are attached to the encounter.  Assessment:   1. Onychomycosis of multiple toenails with type 2 diabetes mellitus and peripheral neuropathy ([place])    Plan:  Patient was evaluated and treated and all questions answered.  Onychomycosis, Diabetes, and DPN -Patient is diabetic with a qualifying condition for at risk foot care.  Procedure: Nail Debridement Type of Debridement: manual, sharp debridement. Instrumentation: Nail nipper, rotary burr. Number of Nails: 10     No follow-ups on file.

## 2022-03-04 ENCOUNTER — Ambulatory Visit: Payer: Medicare Other | Admitting: Podiatry

## 2022-03-04 ENCOUNTER — Encounter: Payer: Self-pay | Admitting: Podiatry

## 2022-03-04 DIAGNOSIS — B351 Tinea unguium: Secondary | ICD-10-CM | POA: Diagnosis not present

## 2022-03-04 DIAGNOSIS — E1142 Type 2 diabetes mellitus with diabetic polyneuropathy: Secondary | ICD-10-CM | POA: Diagnosis not present

## 2022-03-04 DIAGNOSIS — E1169 Type 2 diabetes mellitus with other specified complication: Secondary | ICD-10-CM

## 2022-03-04 NOTE — Progress Notes (Signed)
This patient presents to the office with chief complaint of long thick painful nails.  Patient says the nails are painful walking and wearing shoes.  This patient is unable to self treat.  This patient is unable to trim her nails since she is unable to reach her nails. She presents to the office  with her daughter. She presents to the office for preventative foot care services. ? ?General Appearance  Alert, conversant and in no acute stress. ? ?Vascular  Dorsalis pedis and posterior tibial  pulses are  weakly palpable  bilaterally.  Capillary return is within normal limits  bilaterally. Temperature is within normal limits  bilaterally. ? ?Neurologic  Senn-Weinstein monofilament wire test absent  bilaterally. Muscle power within normal limits bilaterally. ? ?Nails Thick disfigured discolored nails with subungual debris  from hallux to fifth toes bilaterally. No evidence of bacterial infection or drainage bilaterally. ? ?Orthopedic  No limitations of motion  feet .  No crepitus or effusions noted.  No bony pathology or digital deformities noted. ? ?Skin  normotropic skin with no porokeratosis noted bilaterally.  No signs of infections or ulcers noted.    ? ?Onychomycosis  Nails  B/L.  Pain in right toes  Pain in left toes ? ?Debridement of nails both feet followed trimming the nails with dremel tool.    RTC 3 months. ? ? ?Gardiner Barefoot DPM   ?

## 2022-03-06 ENCOUNTER — Ambulatory Visit: Payer: Medicare Other | Admitting: Podiatry

## 2022-06-24 ENCOUNTER — Ambulatory Visit: Payer: Medicare Other | Admitting: Podiatry

## 2022-07-01 ENCOUNTER — Emergency Department (HOSPITAL_COMMUNITY): Admission: EM | Admit: 2022-07-01 | Discharge: 2022-07-01 | Discharge status: Against Medical Advice | Payer: 59

## 2022-07-01 NOTE — ED Notes (Signed)
Pt looked at lobby and did not want to wait

## 2022-07-02 ENCOUNTER — Ambulatory Visit
Admission: RE | Admit: 2022-07-02 | Discharge: 2022-07-02 | Disposition: A | Discharge status: Home / Self Care | Payer: Medicare Other | Source: Ambulatory Visit | Attending: Family Medicine | Admitting: Family Medicine

## 2022-07-02 ENCOUNTER — Other Ambulatory Visit: Payer: Self-pay | Admitting: Family Medicine

## 2022-07-02 DIAGNOSIS — W19XXXA Unspecified fall, initial encounter: Secondary | ICD-10-CM

## 2022-07-06 ENCOUNTER — Ambulatory Visit (INDEPENDENT_AMBULATORY_CARE_PROVIDER_SITE_OTHER): Payer: Medicare Other | Admitting: Podiatry

## 2022-07-06 ENCOUNTER — Encounter: Payer: Self-pay | Admitting: Podiatry

## 2022-07-06 DIAGNOSIS — E1142 Type 2 diabetes mellitus with diabetic polyneuropathy: Secondary | ICD-10-CM | POA: Diagnosis not present

## 2022-07-06 DIAGNOSIS — B351 Tinea unguium: Secondary | ICD-10-CM | POA: Diagnosis not present

## 2022-07-06 DIAGNOSIS — E1169 Type 2 diabetes mellitus with other specified complication: Secondary | ICD-10-CM | POA: Diagnosis not present

## 2022-07-06 NOTE — Progress Notes (Signed)
This patient presents to the office with chief complaint of long thick painful nails.  Patient says the nails are painful walking and wearing shoes.  This patient is unable to self treat.  This patient is unable to trim her nails since she is unable to reach her nails. She presents to the office  with her daughter. She presents to the office for preventative foot care services.  General Appearance  Alert, conversant and in no acute stress.  Vascular  Dorsalis pedis and posterior tibial  pulses are  weakly palpable  bilaterally.  Capillary return is within normal limits  bilaterally. Temperature is within normal limits  bilaterally.  Neurologic  Senn-Weinstein monofilament wire test absent  bilaterally. Muscle power within normal limits bilaterally.  Nails Thick disfigured discolored nails with subungual debris  hallux nails  B/L.bilaterally. No evidence of bacterial infection or drainage bilaterally.  Orthopedic  No limitations of motion  feet .  No crepitus or effusions noted.  No bony pathology or digital deformities noted.  Skin  normotropic skin with no porokeratosis noted bilaterally.  No signs of infections or ulcers noted.     Onychomycosis  Nails  B/L.  Pain in right toes  Pain in left toes  Debridement of nails both feet followed trimming the nails with dremel tool.    RTC 4  months.   Jadriel Saxer DPM   

## 2022-11-18 ENCOUNTER — Ambulatory Visit (INDEPENDENT_AMBULATORY_CARE_PROVIDER_SITE_OTHER): Payer: Medicare Other | Admitting: Podiatry

## 2022-11-18 ENCOUNTER — Encounter: Payer: Self-pay | Admitting: Podiatry

## 2022-11-18 DIAGNOSIS — E1142 Type 2 diabetes mellitus with diabetic polyneuropathy: Secondary | ICD-10-CM | POA: Diagnosis not present

## 2022-11-18 DIAGNOSIS — B351 Tinea unguium: Secondary | ICD-10-CM

## 2022-11-18 DIAGNOSIS — E1169 Type 2 diabetes mellitus with other specified complication: Secondary | ICD-10-CM | POA: Diagnosis not present

## 2022-11-18 NOTE — Progress Notes (Signed)
This patient presents to the office with chief complaint of long thick painful nails.  Patient says the nails are painful walking and wearing shoes.  This patient is unable to self treat.  This patient is unable to trim her nails since she is unable to reach her nails. She presents to the office  with her daughter. She presents to the office for preventative foot care services.  General Appearance  Alert, conversant and in no acute stress.  Vascular  Dorsalis pedis and posterior tibial  pulses are  weakly palpable  bilaterally.  Capillary return is within normal limits  bilaterally. Temperature is within normal limits  bilaterally.  Neurologic  Senn-Weinstein monofilament wire test absent  bilaterally. Muscle power within normal limits bilaterally.  Nails Thick disfigured discolored nails with subungual debris  hallux nails  B/L.bilaterally. No evidence of bacterial infection or drainage bilaterally.  Orthopedic  No limitations of motion  feet .  No crepitus or effusions noted.  No bony pathology or digital deformities noted.  Skin  normotropic skin with no porokeratosis noted bilaterally.  No signs of infections or ulcers noted.     Onychomycosis  Nails  B/L.  Pain in right toes  Pain in left toes  Debridement of nails both feet followed trimming the nails with dremel tool.    RTC 4  months.   Gardiner Barefoot DPM

## 2023-03-22 ENCOUNTER — Ambulatory Visit (INDEPENDENT_AMBULATORY_CARE_PROVIDER_SITE_OTHER): Payer: Medicare Other | Admitting: Podiatry

## 2023-03-22 ENCOUNTER — Ambulatory Visit: Payer: Medicare Other | Admitting: Podiatry

## 2023-03-22 ENCOUNTER — Encounter: Payer: Self-pay | Admitting: Podiatry

## 2023-03-22 DIAGNOSIS — E1169 Type 2 diabetes mellitus with other specified complication: Secondary | ICD-10-CM

## 2023-03-22 DIAGNOSIS — B351 Tinea unguium: Secondary | ICD-10-CM | POA: Diagnosis not present

## 2023-03-22 DIAGNOSIS — E1142 Type 2 diabetes mellitus with diabetic polyneuropathy: Secondary | ICD-10-CM | POA: Diagnosis not present

## 2023-03-22 NOTE — Progress Notes (Signed)
This patient presents to the office with chief complaint of long thick painful nails.  Patient says the nails are painful walking and wearing shoes.  This patient is unable to self treat.  This patient is unable to trim her nails since she is unable to reach her nails. She presents to the office  with her daughter. She presents to the office for preventative foot care services.  General Appearance  Alert, conversant and in no acute stress.  Vascular  Dorsalis pedis and posterior tibial  pulses are  weakly palpable  bilaterally.  Capillary return is within normal limits  bilaterally. Temperature is within normal limits  bilaterally.  Neurologic  Senn-Weinstein monofilament wire test absent  bilaterally. Muscle power within normal limits bilaterally.  Nails Thick disfigured discolored nails with subungual debris  hallux nails  B/L.bilaterally. No evidence of bacterial infection or drainage bilaterally.  Orthopedic  No limitations of motion  feet .  No crepitus or effusions noted.  No bony pathology or digital deformities noted.  Skin  normotropic skin with no porokeratosis noted bilaterally.  No signs of infections or ulcers noted.     Onychomycosis  Nails  B/L.  Pain in right toes  Pain in left toes  Debridement of nails both feet followed trimming the nails with dremel tool.    RTC 3   months.   Helane Gunther DPM

## 2023-05-04 ENCOUNTER — Emergency Department (HOSPITAL_COMMUNITY): Payer: Medicare Other

## 2023-05-04 ENCOUNTER — Emergency Department (HOSPITAL_COMMUNITY)
Admission: EM | Admit: 2023-05-04 | Discharge: 2023-05-04 | Disposition: A | Discharge status: Home / Self Care | Payer: Medicare Other | Attending: Emergency Medicine | Admitting: Emergency Medicine

## 2023-05-04 ENCOUNTER — Other Ambulatory Visit: Payer: Self-pay

## 2023-05-04 DIAGNOSIS — M25551 Pain in right hip: Secondary | ICD-10-CM | POA: Diagnosis present

## 2023-05-04 DIAGNOSIS — W010XXA Fall on same level from slipping, tripping and stumbling without subsequent striking against object, initial encounter: Secondary | ICD-10-CM | POA: Diagnosis not present

## 2023-05-04 DIAGNOSIS — E119 Type 2 diabetes mellitus without complications: Secondary | ICD-10-CM | POA: Insufficient documentation

## 2023-05-04 DIAGNOSIS — R519 Headache, unspecified: Secondary | ICD-10-CM | POA: Diagnosis not present

## 2023-05-04 DIAGNOSIS — S3210XA Unspecified fracture of sacrum, initial encounter for closed fracture: Secondary | ICD-10-CM | POA: Diagnosis not present

## 2023-05-04 DIAGNOSIS — I10 Essential (primary) hypertension: Secondary | ICD-10-CM | POA: Diagnosis not present

## 2023-05-04 DIAGNOSIS — W19XXXA Unspecified fall, initial encounter: Secondary | ICD-10-CM

## 2023-05-04 LAB — BASIC METABOLIC PANEL
Anion gap: 13 (ref 5–15)
BUN: 27 mg/dL — ABNORMAL HIGH (ref 8–23)
CO2: 26 mmol/L (ref 22–32)
Calcium: 9.4 mg/dL (ref 8.9–10.3)
Chloride: 95 mmol/L — ABNORMAL LOW (ref 98–111)
Creatinine, Ser: 0.68 mg/dL (ref 0.44–1.00)
GFR, Estimated: >60 mL/min (ref >60)
Glucose, Bld: 132 mg/dL — ABNORMAL HIGH (ref 70–99)
Potassium: 3.6 mmol/L (ref 3.5–5.1)
Sodium: 134 mmol/L — ABNORMAL LOW (ref 135–145)

## 2023-05-04 LAB — CBC WITH DIFFERENTIAL/PLATELET
Abs Immature Granulocytes: 0.04 10*3/uL (ref 0.00–0.07)
Basophils Absolute: 0 10*3/uL (ref 0.0–0.1)
Basophils Relative: 0 %
Eosinophils Absolute: 0 10*3/uL (ref 0.0–0.5)
Eosinophils Relative: 0 %
HCT: 37 % (ref 36.0–46.0)
Hemoglobin: 12.2 g/dL (ref 12.0–15.0)
Immature Granulocytes: 0 %
Lymphocytes Relative: 8 %
Lymphs Abs: 1.1 10*3/uL (ref 0.7–4.0)
MCH: 31.4 pg (ref 26.0–34.0)
MCHC: 33 g/dL (ref 30.0–36.0)
MCV: 95.4 fL (ref 80.0–100.0)
Monocytes Absolute: 0.9 10*3/uL (ref 0.1–1.0)
Monocytes Relative: 7 %
Neutro Abs: 10.7 10*3/uL — ABNORMAL HIGH (ref 1.7–7.7)
Neutrophils Relative %: 85 %
Platelets: 215 10*3/uL (ref 150–400)
RBC: 3.88 MIL/uL (ref 3.87–5.11)
RDW: 15.3 % (ref 11.5–15.5)
WBC: 12.7 10*3/uL — ABNORMAL HIGH (ref 4.0–10.5)
nRBC: 0 % (ref 0.0–0.2)

## 2023-05-04 LAB — CK: Total CK: 119 U/L (ref 38–234)

## 2023-05-04 MED ORDER — OXYCODONE HCL 5 MG PO TABS: 2.5000 mg | ORAL_TABLET | Freq: Three times a day (TID) | ORAL | 0 refills | Status: DC | PRN

## 2023-05-04 NOTE — ED Notes (Signed)
Pt was able to get out of bed and stand at her baseline.

## 2023-05-04 NOTE — Discharge Instructions (Addendum)
It was a pleasure caring for you today in the emergency department.  Please return to the emergency department for any worsening or worrisome symptoms.  Imaging today did show a nondisplaced age-indeterminate fracture of your sacrum, please follow-up with orthopedics if pain continues.

## 2023-05-04 NOTE — ED Provider Notes (Signed)
[place] EMERGENCY DEPARTMENT AT [facility] Provider Note  CSN: [id number] Arrival date & time: 05/04/23 6578  Chief Complaint(s) Fall  HPI Donna Sanford is a 89 y.o. female with past medical history as below, significant for DM, GERD, HTN, HLD, walker use, lives alone who presents to the ED with complaint of fall.  Patient is a poor historian secondary to underlying dementia.  She is unsure of the events leading up to her presentation in the ER today.  Family lives next-door and observed the patient vascular exam as per reports that she had a fall last night around 11 PM.  She is on the ground until around 8 AM this morning.  On the security cameras appears that she tripped over something on the floor or on her walker.  No blood thinners.  Patient complaining of headache and neck pain, right hip pain, right knee pain to EMS on arrival.  No chest pain, dyspnea, nausea vomiting no incontinence.  She did not endorse any syncope but she is a poor historian  Level 5 caveat dementia  Past Medical History Past Medical History:  Diagnosis Date   Diabetes mellitus without complication (HCC)    GERD (gastroesophageal reflux disease)    Glaucoma    Hyperlipidemia    Hypertension    Macular degeneration    Patient Active Problem List   Diagnosis Date Noted   Hyperlipidemia    Hypertension    Home Medication(s) Prior to Admission medications   Medication Sig Start Date End Date Taking? Authorizing Provider  oxyCODONE (ROXICODONE) 5 MG immediate release tablet Take 0.5 tablets (2.5 mg total) by mouth every 8 (eight) hours as needed for severe pain. 05/04/23  Yes Tanda Rockers A, DO  atorvastatin (LIPITOR) 80 MG tablet Take 80 mg by mouth daily.    [provider]  Calcium Carbonate-Vitamin D (CALTRATE 600+D PO) Take by mouth.    [provider]  candesartan (ATACAND) 32 MG tablet Take 32 mg by mouth daily.    [provider]  meclizine (ANTIVERT) 25 MG  tablet Take 25 mg by mouth 3 (three) times daily as needed for dizziness.    [provider]  metFORMIN (GLUCOPHAGE) 500 MG tablet Take by mouth 2 (two) times daily with a meal.    [provider]  pioglitazone (ACTOS) 45 MG tablet Take 45 mg by mouth daily.    [provider]  Polyethyl Glycol-Propyl Glycol (SYSTANE) 0.4-0.3 % SOLN Apply to eye.    [provider]                                                                                                                                    Past Surgical History No past surgical history on file. Family History Family History  Family history unknown: Yes    Social History Social History   Tobacco Use   Smoking status: Never  Substance Use  Topics   Alcohol use: No   Drug use: No   Allergies Codeine, Fosamax [alendronate sodium], Lisinopril, Other, Raloxifene, and Sulfa antibiotics  Review of Systems Review of Systems  Constitutional:  Negative for activity change and fever.  HENT:  Negative for facial swelling and trouble swallowing.   Eyes:  Negative for discharge and redness.  Respiratory:  Negative for cough and shortness of breath.   Cardiovascular:  Negative for chest pain and palpitations.  Gastrointestinal:  Negative for abdominal pain and nausea.  Genitourinary:  Negative for dysuria and flank pain.  Musculoskeletal:  Positive for arthralgias and neck pain. Negative for back pain and gait problem.  Skin:  Negative for pallor and rash.  Neurological:  Positive for headaches. Negative for syncope.    Physical Exam Vital Signs  I have reviewed the triage vital signs BP (!) 145/84 (BP Location: Right Arm)   Pulse (!) 54   Temp 97.7 F (36.5 C)   Resp 17   Ht 5\' 2"  (1.575 m)   Wt 55.3 kg   SpO2 94%   BMI 22.31 kg/m  Physical Exam Vitals and nursing note reviewed.  Constitutional:      General: She is not in acute distress.    Appearance: Normal appearance. She is not  ill-appearing.  HENT:     Head: Normocephalic and atraumatic.     Right Ear: External ear normal.     Left Ear: External ear normal.     Nose: Nose normal.     Mouth/Throat:     Mouth: Mucous membranes are moist.  Eyes:     General: No scleral icterus.       Right eye: No discharge.        Left eye: No discharge.     Extraocular Movements: Extraocular movements intact.     Pupils: Pupils are equal, round, and reactive to light.  Neck:     Comments: C-collar Cardiovascular:     Rate and Rhythm: Normal rate and regular rhythm.     Pulses: Normal pulses.     Heart sounds: Normal heart sounds.  Pulmonary:     Effort: Pulmonary effort is normal. No respiratory distress.     Breath sounds: Normal breath sounds.  Abdominal:     General: Abdomen is flat.     Palpations: Abdomen is soft.     Tenderness: There is no abdominal tenderness.  Musculoskeletal:        General: Normal range of motion.     Cervical back: No tenderness.     Right lower leg: No edema.     Left lower leg: No edema.       Legs:     Comments: Pelvis stable AP pressure Pain to right lower extremity with logroll. LE NVI bilateral    Skin:    General: Skin is warm and dry.     Capillary Refill: Capillary refill takes less than 2 seconds.  Neurological:     Mental Status: She is alert.     GCS: GCS eye subscore is 4. GCS verbal subscore is 5. GCS motor subscore is 6.     Cranial Nerves: Cranial nerves 2-12 are intact.     Sensory: Sensation is intact.     Motor: Motor function is intact.     Coordination: Coordination is intact.     Comments: Alert and oriented to person Unsure of place and time > baseline per ems  Psychiatric:        Mood  and Affect: Mood normal.        Behavior: Behavior normal.     ED Results and Treatments Labs (all labs ordered are listed, but only abnormal results are displayed) Labs Reviewed  CBC WITH DIFFERENTIAL/PLATELET - Abnormal; Notable for the following components:       Result Value   WBC 12.7 (*)    Neutro Abs 10.7 (*)    All other components within normal limits  BASIC METABOLIC PANEL - Abnormal; Notable for the following components:   Sodium 134 (*)    Chloride 95 (*)    Glucose, Bld 132 (*)    BUN 27 (*)    All other components within normal limits  CK                                                                                                                          Radiology CT Hip Right Wo Contrast  Result Date: 05/04/2023 CLINICAL DATA:  Hip trauma, fracture suspected. Patient fell last night and has persistent hip pain. EXAM: CT OF THE RIGHT HIP WITHOUT CONTRAST TECHNIQUE: Multidetector CT imaging of the right hip was performed according to the standard protocol. Multiplanar CT image reconstructions were also generated. RADIATION DOSE REDUCTION: This exam was performed according to the departmental dose-optimization program which includes automated exposure control, adjustment of the mA and/or kV according to patient size and/or use of iterative reconstruction technique. COMPARISON:  Radiographs same date.  No other comparison studies. FINDINGS: Bones/Joint/Cartilage The bones are demineralized. No evidence of acute hip fracture, dislocation or femoral head osteonecrosis. Mild right hip degenerative changes without significant joint effusion. The right sacroiliac joint appears unremarkable. There is focal angulation of the anterior cortex of the mid sacrum which is best seen on the sagittal images. There is a linear anterior cortical defect on the coronal images (image 78/7), but no adjacent soft tissue abnormality. Mild facet hypertrophy in the lower lumbar spine. 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 Ligaments Suboptimally assessed by CT. Muscles and Tendons Mild generalized muscular atrophy. No focal muscular hematoma or acute tendon abnormality identified. Soft tissues Mild nonspecific subcutaneous edema within the right buttocks. No focal fluid  collection, foreign body or soft tissue emphysema identified. Iliofemoral atherosclerosis noted. 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 IMPRESSION: 1. No evidence of acute right hip fracture or dislocation. 2. Age indeterminate nondisplaced fracture of the mid sacrum. The right sacroiliac joint appears intact. 3. Mild nonspecific subcutaneous edema within the right buttocks. No focal fluid collection, foreign body or soft tissue emphysema identified. 4. 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 Electronically Signed   By: Carey Bullocks M.D.   On: 05/04/2023 15:15   DG Chest 1 View  Result Date: 05/04/2023 CLINICAL DATA:  Pain after fall EXAM: CHEST  1 VIEW COMPARISON:  None Available.  FINDINGS: Hyperinflation. Slight linear opacity left lung base likely scar or atelectasis. No consolidation, pneumothorax or effusion. No edema. Normal cardiopericardial silhouette. Calcified aorta. Overlapping cardiac leads. IMPRESSION: Hyperinflation. Slight left basilar atelectasis or scar. Calcified and tortuous aorta. Electronically Signed   By: Karen Kays M.D.   On: 05/04/2023 11:43   DG Knee Complete 4 Views Right  Result Date: 05/04/2023 CLINICAL DATA:  Pain after fall EXAM: RIGHT KNEE - COMPLETE 5 VIEW COMPARISON:  None Available. FINDINGS: Osteopenia. No acute fracture or dislocation. Preserved joint spaces. Prominent vascular calcifications. No definite joint effusion. IMPRESSION: Severe osteopenia.  No acute osseous abnormality. Electronically Signed   By: Karen Kays M.D.   On: 05/04/2023 11:42   DG Hip Unilat W or Wo Pelvis 2-3 Views Right  Result Date: 05/04/2023 CLINICAL DATA:  Pain after fall EXAM: DG HIP (WITH OR WITHOUT PELVIS) 3V RIGHT COMPARISON:  None Available. FINDINGS: Osteopenia. No acute  fracture or dislocation. Preserved joint spaces. Extensive vascular calcifications. With this level of severe osteopenia, evaluation for subtle nondisplaced injury is limited and if needed additional cross-sectional imaging study as clinically appropriate. IMPRESSION: Severe osteopenia.  No acute osseous abnormality. Electronically Signed   By: Karen Kays M.D.   On: 05/04/2023 11:41   CT Head Wo Contrast  Result Date: 05/04/2023 CLINICAL DATA:  Fall last night, found down EXAM: CT HEAD WITHOUT CONTRAST CT CERVICAL SPINE WITHOUT CONTRAST TECHNIQUE: Multidetector CT imaging of the head and cervical spine was performed following the standard protocol without intravenous contrast. Multiplanar CT image reconstructions of the cervical spine were also generated. RADIATION DOSE REDUCTION: This exam was performed according to the departmental dose-optimization program which includes automated exposure control, adjustment of the mA and/or kV according to patient size and/or use of iterative reconstruction technique. COMPARISON:  07/02/2022 FINDINGS: CT HEAD FINDINGS Brain: No evidence of acute infarction, hemorrhage, hydrocephalus, extra-axial collection or mass lesion/mass effect. Periventricular and deep white matter hypodensity. Vascular: No hyperdense vessel or unexpected calcification. Skull: Normal. Negative for fracture or focal lesion. Sinuses/Orbits: Partial opacification of the mastoid air cells, right-greater-than-left. Other: None. CT CERVICAL SPINE FINDINGS Alignment: Normal. Skull base and vertebrae: No acute fracture. No primary bone lesion or focal pathologic process. Soft tissues and spinal canal: No prevertebral fluid or swelling. No visible canal hematoma. Disc levels: Mild multilevel disc space height loss and osteophytosis throughout the cervical spine. Upper chest: Partially imaged large nodule arising from the posterior right lobe of the thyroid (series 8, image 79). Other: None. IMPRESSION: 1. No  acute intracranial pathology. Small-vessel white matter disease. 2. No fracture or static subluxation of the cervical spine. 3. Partially imaged large nodule arising from the posterior right lobe of the thyroid. In the setting of significant comorbidities or limited life expectancy, no follow-up recommended (ref: J Am Coll Radiol. 2015 Feb;12(2): 143-50). Electronically Signed   By: Jearld Lesch M.D.   On: 05/04/2023 11:20   CT Cervical Spine Wo Contrast  Result Date: 05/04/2023 CLINICAL DATA:  Fall last night, found down EXAM: CT HEAD WITHOUT CONTRAST CT CERVICAL SPINE WITHOUT CONTRAST TECHNIQUE: Multidetector CT imaging of the head and cervical spine was performed following the standard protocol without intravenous contrast. Multiplanar CT image reconstructions of the cervical spine were also generated. RADIATION DOSE REDUCTION: This exam was performed according to the departmental dose-optimization program which includes automated exposure control, adjustment of the mA and/or kV according to patient size and/or use of iterative reconstruction technique. COMPARISON:  07/02/2022 FINDINGS: CT HEAD FINDINGS  Brain: No evidence of acute infarction, hemorrhage, hydrocephalus, extra-axial collection or mass lesion/mass effect. Periventricular and deep white matter hypodensity. Vascular: No hyperdense vessel or unexpected calcification. Skull: Normal. Negative for fracture or focal lesion. Sinuses/Orbits: Partial opacification of the mastoid air cells, right-greater-than-left. Other: None. CT CERVICAL SPINE FINDINGS Alignment: Normal. Skull base and vertebrae: No acute fracture. No primary bone lesion or focal pathologic process. Soft tissues and spinal canal: No prevertebral fluid or swelling. No visible canal hematoma. Disc levels: Mild multilevel disc space height loss and osteophytosis throughout the cervical spine. Upper chest: Partially imaged large nodule arising from the posterior right lobe of the thyroid  (series 8, image 79). Other: None. IMPRESSION: 1. No acute intracranial pathology. Small-vessel white matter disease. 2. No fracture or static subluxation of the cervical spine. 3. Partially imaged large nodule arising from the posterior right lobe of the thyroid. In the setting of significant comorbidities or limited life expectancy, no follow-up recommended (ref: J Am Coll Radiol. 2015 Feb;12(2): 143-50). Electronically Signed   By: Jearld Lesch M.D.   On: 05/04/2023 11:20    Pertinent labs & imaging results that were available during my care of the patient were reviewed by me and considered in my medical decision making (see MDM for details).  Medications Ordered in ED Medications - No data to display                                                                                                                                   Procedures Procedures  (including critical care time)  Medical Decision Making / ED Course    Medical Decision Making:    Donna Sanford is a 89 y.o. female with past medical history as below, significant for DM, GERD, HTN, HLD, walker use, lives alone who presents to the ED with complaint of fall. . The complaint involves an extensive differential diagnosis and also carries with it a high risk of complications and morbidity.  Serious etiology was considered. Ddx includes but is not limited to: Differential diagnoses for head trauma includes subdural hematoma, epidural hematoma, acute concussion, traumatic subarachnoid hemorrhage, cerebral contusions, soft tissue injury, sprain, strain, fracture, rhabdomyolysis etc.   Complete initial physical exam performed, notably the patient  was no acute distress, pleasantly demented.    Reviewed and confirmed nursing documentation for past medical history, family history, social history.  Vital signs reviewed.    Clinical Course as of 05/05/23 4540  Tue May 04, 2023  1542 CT with age-indeterminate nondisplaced sacral  fracture.  She is otherwise stable.  Is able to ambulate her typical level, she uses a walker normally at home.  Plan to discharge patient in care of daughter.  Follow-up orthopedics as needed.  Strict return precautions were emphasized with daughter at bedside. Advise consider senior living/snf  [SG]    Clinical Course User Index [SG] Sloan Leiter, DO    Patient with fall at  home.  She lives alone.  Daughter Bedside.  Imaging reviewed and stable.  She has nondisplaced age-indeterminate sacral fracture.  She follows [facility].  Advised to follow-up with them if she has persistent pain to her sacral area.  She is ambulatory to the level, she uses a walker at baseline.  Daughter will take the patient home.  Stable discharge at this time.  The patient improved significantly and was discharged in stable condition. Detailed discussions were had with the patient regarding current findings, and need for close f/u with PCP or on call doctor. The patient has been instructed to return immediately if the symptoms worsen in any way for re-evaluation. Patient verbalized understanding and is in agreement with current care plan. All questions answered prior to discharge.      Additional history obtained: -Additional history obtained from family and ems -External records from outside source obtained and reviewed including: Chart review including previous notes, labs, imaging, consultation notes including home medications, primary care recommendation, prior labs and imaging   Lab Tests: -I ordered, reviewed, and interpreted labs.   The pertinent results include:   Labs Reviewed  CBC WITH DIFFERENTIAL/PLATELET - Abnormal; Notable for the following components:      Result Value   WBC 12.7 (*)    Neutro Abs 10.7 (*)    All other components within normal limits  BASIC METABOLIC PANEL - Abnormal; Notable for the following components:   Sodium 134 (*)    Chloride 95 (*)    Glucose, Bld 132 (*)    BUN  27 (*)    All other components within normal limits  CK    Notable for labs stable  EKG   EKG Interpretation  Date/Time:    Ventricular Rate:    PR Interval:    QRS Duration:   QT Interval:    QTC Calculation:   R Axis:     Text Interpretation:           Imaging Studies ordered: I ordered imaging studies including CT head, cervical spine, hip x-ray, knee x-ray, chest x-ray CT hip I independently visualized the following imaging with scope of interpretation limited to determining acute life threatening conditions related to emergency care; findings noted above, significant for age-indeterminate nondisplaced sacral fracture I independently visualized and interpreted imaging. I agree with the radiologist interpretation   Medicines ordered and prescription drug management: Meds ordered this encounter  Medications   oxyCODONE (ROXICODONE) 5 MG immediate release tablet    Sig: Take 0.5 tablets (2.5 mg total) by mouth every 8 (eight) hours as needed for severe pain.    Dispense:  10 tablet    Refill:  0    -I have reviewed the patients home medicines and have made adjustments as needed   Consultations Obtained: na   Cardiac Monitoring: The patient was maintained on a cardiac monitor.  I personally viewed and interpreted the cardiac monitored which showed an underlying rhythm of: SR  Social Determinants of Health:  Diagnosis or treatment significantly limited by social determinants of health: lives alone   Reevaluation: After the interventions noted above, I reevaluated the patient and found that they have improved  Co morbidities that complicate the patient evaluation  Past Medical History:  Diagnosis Date   Diabetes mellitus without complication (HCC)    GERD (gastroesophageal reflux disease)    Glaucoma    Hyperlipidemia    Hypertension    Macular degeneration       Dispostion: Disposition decision including  need for hospitalization was considered, and  patient discharged from emergency department.    Final Clinical Impression(s) / ED Diagnoses Final diagnoses:  Fall, initial encounter  Closed fracture of sacrum, unspecified portion of sacrum, initial encounter [facility])     This chart was dictated using voice recognition software.  Despite best efforts to proofread,  errors can occur which can change the documentation meaning.    Sloan Leiter, DO 05/05/23 [phone]

## 2023-05-04 NOTE — ED Triage Notes (Signed)
Pt to the ed from home with a CC of fall. Pt has dementia at baseline but lives at home alone where the family will monitor her with cameras. Family relays pt fell last night around 11 pm and was  on the ground this morning until 9 am. Pt relays neck pain. Family could not tell if pt hit her head. No blood thinners.

## 2023-05-25 ENCOUNTER — Emergency Department (HOSPITAL_COMMUNITY): Payer: Medicare Other

## 2023-05-25 ENCOUNTER — Inpatient Hospital Stay (HOSPITAL_COMMUNITY)
Admission: EM | Admit: 2023-05-25 | Discharge: 2023-05-28 | DRG: 640 | Disposition: A | Discharge status: Skilled Nursing Facility | Payer: Medicare Other | Attending: Family Medicine | Admitting: Family Medicine

## 2023-05-25 ENCOUNTER — Other Ambulatory Visit: Payer: Self-pay

## 2023-05-25 ENCOUNTER — Encounter (HOSPITAL_COMMUNITY): Payer: Self-pay | Admitting: Emergency Medicine

## 2023-05-25 DIAGNOSIS — Z888 Allergy status to other drugs, medicaments and biological substances status: Secondary | ICD-10-CM

## 2023-05-25 DIAGNOSIS — Z66 Do not resuscitate: Secondary | ICD-10-CM | POA: Diagnosis present

## 2023-05-25 DIAGNOSIS — Z882 Allergy status to sulfonamides status: Secondary | ICD-10-CM

## 2023-05-25 DIAGNOSIS — S3210XA Unspecified fracture of sacrum, initial encounter for closed fracture: Secondary | ICD-10-CM | POA: Diagnosis present

## 2023-05-25 DIAGNOSIS — F0390 Unspecified dementia without behavioral disturbance: Secondary | ICD-10-CM | POA: Diagnosis present

## 2023-05-25 DIAGNOSIS — Y939 Activity, unspecified: Secondary | ICD-10-CM

## 2023-05-25 DIAGNOSIS — E861 Hypovolemia: Secondary | ICD-10-CM | POA: Diagnosis present

## 2023-05-25 DIAGNOSIS — Z87440 Personal history of urinary (tract) infections: Secondary | ICD-10-CM

## 2023-05-25 DIAGNOSIS — R55 Syncope and collapse: Secondary | ICD-10-CM | POA: Diagnosis not present

## 2023-05-25 DIAGNOSIS — E871 Hypo-osmolality and hyponatremia: Principal | ICD-10-CM | POA: Diagnosis present

## 2023-05-25 DIAGNOSIS — I10 Essential (primary) hypertension: Secondary | ICD-10-CM | POA: Diagnosis present

## 2023-05-25 DIAGNOSIS — K219 Gastro-esophageal reflux disease without esophagitis: Secondary | ICD-10-CM | POA: Diagnosis present

## 2023-05-25 DIAGNOSIS — G9341 Metabolic encephalopathy: Secondary | ICD-10-CM | POA: Diagnosis present

## 2023-05-25 DIAGNOSIS — B961 Klebsiella pneumoniae [K. pneumoniae] as the cause of diseases classified elsewhere: Secondary | ICD-10-CM | POA: Diagnosis present

## 2023-05-25 DIAGNOSIS — E872 Acidosis, unspecified: Secondary | ICD-10-CM | POA: Diagnosis present

## 2023-05-25 DIAGNOSIS — Z1152 Encounter for screening for COVID-19: Secondary | ICD-10-CM

## 2023-05-25 DIAGNOSIS — N390 Urinary tract infection, site not specified: Secondary | ICD-10-CM | POA: Diagnosis present

## 2023-05-25 DIAGNOSIS — G9340 Encephalopathy, unspecified: Secondary | ICD-10-CM

## 2023-05-25 DIAGNOSIS — R7989 Other specified abnormal findings of blood chemistry: Secondary | ICD-10-CM | POA: Diagnosis present

## 2023-05-25 DIAGNOSIS — Z7984 Long term (current) use of oral hypoglycemic drugs: Secondary | ICD-10-CM

## 2023-05-25 DIAGNOSIS — E119 Type 2 diabetes mellitus without complications: Secondary | ICD-10-CM | POA: Diagnosis present

## 2023-05-25 DIAGNOSIS — H3530 Unspecified macular degeneration: Secondary | ICD-10-CM | POA: Diagnosis present

## 2023-05-25 DIAGNOSIS — E785 Hyperlipidemia, unspecified: Secondary | ICD-10-CM | POA: Diagnosis present

## 2023-05-25 DIAGNOSIS — Z79899 Other long term (current) drug therapy: Secondary | ICD-10-CM

## 2023-05-25 DIAGNOSIS — Y92009 Unspecified place in unspecified non-institutional (private) residence as the place of occurrence of the external cause: Secondary | ICD-10-CM

## 2023-05-25 DIAGNOSIS — Z885 Allergy status to narcotic agent status: Secondary | ICD-10-CM

## 2023-05-25 DIAGNOSIS — W19XXXA Unspecified fall, initial encounter: Secondary | ICD-10-CM | POA: Diagnosis present

## 2023-05-25 DIAGNOSIS — E049 Nontoxic goiter, unspecified: Secondary | ICD-10-CM | POA: Diagnosis present

## 2023-05-25 DIAGNOSIS — H409 Unspecified glaucoma: Secondary | ICD-10-CM | POA: Diagnosis present

## 2023-05-25 LAB — PROTIME-INR
INR: 1 (ref 0.8–1.2)
Prothrombin Time: 13.3 seconds (ref 11.4–15.2)

## 2023-05-25 LAB — TROPONIN I (HIGH SENSITIVITY)
Troponin I (High Sensitivity): 8 ng/L (ref <18)
Troponin I (High Sensitivity): 8 ng/L (ref <18)

## 2023-05-25 LAB — RESP PANEL BY RT-PCR (RSV, FLU A&B, COVID)  RVPGX2
Influenza A by PCR: NEGATIVE
Influenza B by PCR: NEGATIVE
Resp Syncytial Virus by PCR: NEGATIVE
SARS Coronavirus 2 by RT PCR: NEGATIVE

## 2023-05-25 LAB — URINALYSIS, W/ REFLEX TO CULTURE (INFECTION SUSPECTED)
Bilirubin Urine: NEGATIVE
Glucose, UA: NEGATIVE mg/dL
Hgb urine dipstick: NEGATIVE
Ketones, ur: 5 mg/dL — AB
Nitrite: NEGATIVE
Protein, ur: NEGATIVE mg/dL
Specific Gravity, Urine: 1.011 (ref 1.005–1.030)
pH: 7 (ref 5.0–8.0)

## 2023-05-25 LAB — I-STAT CHEM 8, ED
BUN: 15 mg/dL (ref 8–23)
Calcium, Ion: 0.86 mmol/L — CL (ref 1.15–1.40)
Chloride: 94 mmol/L — ABNORMAL LOW (ref 98–111)
Creatinine, Ser: 0.5 mg/dL (ref 0.44–1.00)
Glucose, Bld: 128 mg/dL — ABNORMAL HIGH (ref 70–99)
HCT: 34 % — ABNORMAL LOW (ref 36.0–46.0)
Hemoglobin: 11.6 g/dL — ABNORMAL LOW (ref 12.0–15.0)
Potassium: 3.8 mmol/L (ref 3.5–5.1)
Sodium: 128 mmol/L — ABNORMAL LOW (ref 135–145)
TCO2: 23 mmol/L (ref 22–32)

## 2023-05-25 LAB — CBC WITH DIFFERENTIAL/PLATELET
Abs Immature Granulocytes: 0.03 10*3/uL (ref 0.00–0.07)
Basophils Absolute: 0 10*3/uL (ref 0.0–0.1)
Basophils Relative: 0 %
Eosinophils Absolute: 0.1 10*3/uL (ref 0.0–0.5)
Eosinophils Relative: 1 %
HCT: 31.4 % — ABNORMAL LOW (ref 36.0–46.0)
Hemoglobin: 10.6 g/dL — ABNORMAL LOW (ref 12.0–15.0)
Immature Granulocytes: 0 %
Lymphocytes Relative: 20 %
Lymphs Abs: 1.4 10*3/uL (ref 0.7–4.0)
MCH: 31.2 pg (ref 26.0–34.0)
MCHC: 33.8 g/dL (ref 30.0–36.0)
MCV: 92.4 fL (ref 80.0–100.0)
Monocytes Absolute: 0.5 10*3/uL (ref 0.1–1.0)
Monocytes Relative: 7 %
Neutro Abs: 5.3 10*3/uL (ref 1.7–7.7)
Neutrophils Relative %: 72 %
Platelets: 279 10*3/uL (ref 150–400)
RBC: 3.4 MIL/uL — ABNORMAL LOW (ref 3.87–5.11)
RDW: 14.1 % (ref 11.5–15.5)
WBC: 7.3 10*3/uL (ref 4.0–10.5)
nRBC: 0 % (ref 0.0–0.2)

## 2023-05-25 LAB — COMPREHENSIVE METABOLIC PANEL
ALT: 15 U/L (ref 0–44)
AST: 31 U/L (ref 15–41)
Albumin: 2.7 g/dL — ABNORMAL LOW (ref 3.5–5.0)
Alkaline Phosphatase: 91 U/L (ref 38–126)
Anion gap: 15 (ref 5–15)
BUN: 16 mg/dL (ref 8–23)
CO2: 23 mmol/L (ref 22–32)
Calcium: 7.8 mg/dL — ABNORMAL LOW (ref 8.9–10.3)
Chloride: 91 mmol/L — ABNORMAL LOW (ref 98–111)
Creatinine, Ser: 0.67 mg/dL (ref 0.44–1.00)
GFR, Estimated: >60 mL/min (ref >60)
Glucose, Bld: 133 mg/dL — ABNORMAL HIGH (ref 70–99)
Potassium: 3.9 mmol/L (ref 3.5–5.1)
Sodium: 129 mmol/L — ABNORMAL LOW (ref 135–145)
Total Bilirubin: 0.7 mg/dL (ref 0.3–1.2)
Total Protein: 5.9 g/dL — ABNORMAL LOW (ref 6.5–8.1)

## 2023-05-25 LAB — I-STAT VENOUS BLOOD GAS, ED
Acid-Base Excess: 2 mmol/L (ref 0.0–2.0)
Bicarbonate: 23.4 mmol/L (ref 20.0–28.0)
Calcium, Ion: 0.89 mmol/L — CL (ref 1.15–1.40)
HCT: 35 % — ABNORMAL LOW (ref 36.0–46.0)
Hemoglobin: 11.9 g/dL — ABNORMAL LOW (ref 12.0–15.0)
O2 Saturation: 99 %
Potassium: 3.8 mmol/L (ref 3.5–5.1)
Sodium: 128 mmol/L — ABNORMAL LOW (ref 135–145)
TCO2: 24 mmol/L (ref 22–32)
pCO2, Ven: 26.5 mmHg — ABNORMAL LOW (ref 44–60)
pH, Ven: 7.554 — ABNORMAL HIGH (ref 7.25–7.43)
pO2, Ven: 98 mmHg — ABNORMAL HIGH (ref 32–45)

## 2023-05-25 LAB — LACTIC ACID, PLASMA
Lactic Acid, Venous: 2.8 mmol/L (ref 0.5–1.9)
Lactic Acid, Venous: 1.4 mmol/L (ref 0.5–1.9)

## 2023-05-25 LAB — MAGNESIUM: Magnesium: 1.7 mg/dL (ref 1.7–2.4)

## 2023-05-25 LAB — D-DIMER, QUANTITATIVE: D-Dimer, Quant: 1.24 ug/mL-FEU — ABNORMAL HIGH (ref 0.00–0.50)

## 2023-05-25 LAB — URINE CULTURE

## 2023-05-25 LAB — POC OCCULT BLOOD, ED: Fecal Occult Bld: NEGATIVE

## 2023-05-25 LAB — BRAIN NATRIURETIC PEPTIDE: B Natriuretic Peptide: 29.4 pg/mL (ref 0.0–100.0)

## 2023-05-25 LAB — CBG MONITORING, ED: Glucose-Capillary: 147 mg/dL — ABNORMAL HIGH (ref 70–99)

## 2023-05-25 LAB — TSH: TSH: 1.185 u[IU]/mL (ref 0.350–4.500)

## 2023-05-25 MED ORDER — GADOBUTROL 1 MMOL/ML IV SOLN
5.0000 mL | Freq: Once | INTRAVENOUS | Status: AC | PRN
  Administered 2023-05-25: 5 mL via INTRAVENOUS

## 2023-05-25 MED ORDER — LACTATED RINGERS IV BOLUS (SEPSIS)
1000.0000 mL | Freq: Once | INTRAVENOUS | Status: AC
  Administered 2023-05-25: 1000 mL via INTRAVENOUS

## 2023-05-25 MED ORDER — SODIUM CHLORIDE 0.9 % IV SOLN
1.0000 g | Freq: Once | INTRAVENOUS | Status: AC
  Administered 2023-05-25: 1 g via INTRAVENOUS
  Filled 2023-05-25: qty 10

## 2023-05-25 MED ORDER — FENTANYL CITRATE PF 50 MCG/ML IJ SOSY
25.0000 ug | PREFILLED_SYRINGE | Freq: Once | INTRAMUSCULAR | Status: AC
  Administered 2023-05-25: 25 ug via INTRAVENOUS
  Filled 2023-05-25: qty 1

## 2023-05-25 MED ORDER — IOHEXOL 350 MG/ML SOLN
75.0000 mL | Freq: Once | INTRAVENOUS | Status: AC | PRN
  Administered 2023-05-25: 75 mL via INTRAVENOUS

## 2023-05-25 NOTE — ED Notes (Signed)
Family at bedside. Per family pt is not acting to baseline. States increase in drowsiness from baseline.

## 2023-05-25 NOTE — ED Triage Notes (Signed)
Pt BIB EMS from home for a syncopal episode during PT. Family reports to EMS that the pt did not fall. Currently alert to baseline. Family concerned about pt having a cough for the past couple days. Pt has broken tail bone from prior fall on 6/15. EMS VS:  116/54 HR 66 97% RA CBG 160

## 2023-05-25 NOTE — ED Provider Notes (Addendum)
[facility] AT [facility] Provider Note   CSN: [id number] Arrival date & time: 05/25/23  1701     History  Chief Complaint  Patient presents with   Loss of Consciousness   Cough    Donna Sanford is a 89 y.o. female.   Loss of Consciousness Associated symptoms: confusion and weakness   Cough Patient presents for syncope.  Medical history DM, HLD, HTN, GERD, glaucoma.  She was seen in the ED 2 weeks ago after a fall.  At the time, she was noted to have an age-indeterminate sacral fracture.  She was discharged home and, at that point, went to live with her daughter.  Daughter reports that she has not been ambulatory since her prior fall.  She was treated for UTI, based on symptoms, through a telehealth visit with her doctor.  This was approximately 1.5 weeks ago.  She subsequently had 1 week of constipation.  She has been having bowel movements since starting stool softeners.  She has been working with PT over the last several days.  Today, she was able to stand and bear weight for the first time in several weeks.  While working with PT, she request to have a bowel movement.  She had a large bowel movement.  Shortly thereafter, she had a syncopal episode.  Loss of consciousness lasted for greater than 5 minutes.  She did not wake up while being carried in the home.  Daughter is not sure if she was stiff or flaccid.  She has no known history of CVA or seizures.  Since this episode, she has not been at her mental baseline.  Although she has dementia, she is typically conversant.  She has also had a recent cough over the past several days.     Home Medications Prior to Admission medications   Medication Sig Start Date End Date Taking? Authorizing Provider  atorvastatin (LIPITOR) 80 MG tablet Take 80 mg by mouth daily.    [provider]  Calcium Carbonate-Vitamin D (CALTRATE 600+D PO) Take by mouth.    [provider]  candesartan (ATACAND)  32 MG tablet Take 32 mg by mouth daily.    [provider]  meclizine (ANTIVERT) 25 MG tablet Take 25 mg by mouth 3 (three) times daily as needed for dizziness.    [provider]  metFORMIN (GLUCOPHAGE) 500 MG tablet Take by mouth 2 (two) times daily with a meal.    [provider]  oxyCODONE (ROXICODONE) 5 MG immediate release tablet Take 0.5 tablets (2.5 mg total) by mouth every 8 (eight) hours as needed for severe pain. 05/04/23   Tanda Rockers A, DO  pioglitazone (ACTOS) 45 MG tablet Take 45 mg by mouth daily.    [provider]  Polyethyl Glycol-Propyl Glycol (SYSTANE) 0.4-0.3 % SOLN Apply to eye.    [provider]      Allergies    Codeine, Fosamax [alendronate sodium], Lisinopril, Other, Raloxifene, and Sulfa antibiotics    Review of Systems   Review of Systems  Respiratory:  Positive for cough.   Cardiovascular:  Positive for syncope.  Gastrointestinal:  Positive for constipation.  Neurological:  Positive for syncope and weakness.  Psychiatric/Behavioral:  Positive for confusion.   All other systems reviewed and are negative.   Physical Exam Updated Vital Signs BP (!) 133/57   Pulse (!) 58   Temp (!) 97.4 F (36.3 C) (Oral)   Resp 19   Ht 5\' 2"  (1.575 m)  Wt 55.3 kg   SpO2 100%   BMI 22.31 kg/m  Physical Exam Vitals and nursing note reviewed.  Constitutional:      General: She is not in acute distress.    Appearance: She is well-developed. She is ill-appearing. She is not toxic-appearing or diaphoretic.  HENT:     Head: Normocephalic and atraumatic.     Right Ear: External ear normal.     Left Ear: External ear normal.     Nose: Nose normal.     Mouth/Throat:     Mouth: Mucous membranes are dry.  Eyes:     Extraocular Movements: Extraocular movements intact.     Conjunctiva/sclera: Conjunctivae normal.  Cardiovascular:     Rate and Rhythm: Normal rate and regular rhythm.     Heart sounds: No murmur  heard. Pulmonary:     Effort: Pulmonary effort is normal. No respiratory distress.     Breath sounds: Normal breath sounds. No wheezing or rales.  Chest:     Chest wall: No tenderness.  Abdominal:     General: There is no distension.     Palpations: Abdomen is soft.     Tenderness: There is no abdominal tenderness.  Musculoskeletal:        General: No swelling or deformity. Normal range of motion.     Cervical back: Normal range of motion and neck supple.  Skin:    General: Skin is warm and dry.     Coloration: Skin is not jaundiced or pale.  Neurological:     Mental Status: She is alert.     GCS: GCS eye subscore is 1. GCS verbal subscore is 4. GCS motor subscore is 5.     Cranial Nerves: No facial asymmetry.     Motor: No abnormal muscle tone.     ED Results / Procedures / Treatments   Labs (all labs ordered are listed, but only abnormal results are displayed) Labs Reviewed  LACTIC ACID, PLASMA - Abnormal; Notable for the following components:      Result Value   Lactic Acid, Venous 2.8 (*)    All other components within normal limits  COMPREHENSIVE METABOLIC PANEL - Abnormal; Notable for the following components:   Sodium 129 (*)    Chloride 91 (*)    Glucose, Bld 133 (*)    Calcium 7.8 (*)    Total Protein 5.9 (*)    Albumin 2.7 (*)    All other components within normal limits  CBC WITH DIFFERENTIAL/PLATELET - Abnormal; Notable for the following components:   RBC 3.40 (*)    Hemoglobin 10.6 (*)    HCT 31.4 (*)    All other components within normal limits  URINALYSIS, W/ REFLEX TO CULTURE (INFECTION SUSPECTED) - Abnormal; Notable for the following components:   APPearance HAZY (*)    Ketones, ur 5 (*)    Leukocytes,Ua LARGE (*)    Bacteria, UA FEW (*)    All other components within normal limits  D-DIMER, QUANTITATIVE - Abnormal; Notable for the following components:   D-Dimer, Quant 1.24 (*)    All other components within normal limits  I-STAT CHEM 8, ED -  Abnormal; Notable for the following components:   Sodium 128 (*)    Chloride 94 (*)    Glucose, Bld 128 (*)    Calcium, Ion 0.86 (*)    Hemoglobin 11.6 (*)    HCT 34.0 (*)    All other components within normal limits  I-STAT VENOUS BLOOD GAS,  ED - Abnormal; Notable for the following components:   pH, Ven 7.554 (*)    pCO2, Ven 26.5 (*)    pO2, Ven 98 (*)    Sodium 128 (*)    Calcium, Ion 0.89 (*)    HCT 35.0 (*)    Hemoglobin 11.9 (*)    All other components within normal limits  CBG MONITORING, ED - Abnormal; Notable for the following components:   Glucose-Capillary 147 (*)    All other components within normal limits  RESP PANEL BY RT-PCR (RSV, FLU A&B, COVID)  [id number]  URINE CULTURE  CULTURE, BLOOD (ROUTINE X 2)  CULTURE, BLOOD (ROUTINE X 2)  LACTIC ACID, PLASMA  PROTIME-INR  BRAIN NATRIURETIC PEPTIDE  MAGNESIUM  TSH  POC OCCULT BLOOD, ED  TROPONIN I (HIGH SENSITIVITY)  TROPONIN I (HIGH SENSITIVITY)    EKG None  Radiology CT Cervical Spine Wo Contrast  Result Date: 05/25/2023 CLINICAL DATA:  Neck trauma (Age >= 65y) EXAM: CT CERVICAL SPINE WITHOUT CONTRAST TECHNIQUE: Multidetector CT imaging of the cervical spine was performed without intravenous contrast. Multiplanar CT image reconstructions were also generated. RADIATION DOSE REDUCTION: This exam was performed according to the departmental dose-optimization program which includes automated exposure control, adjustment of the mA and/or kV according to patient size and/or use of iterative reconstruction technique. COMPARISON:  Cervical spine CT 3 weeks ago 05/04/2023 FINDINGS: Alignment: No traumatic subluxation. Skull base and vertebrae: No acute fracture. The skull base and dens are intact. Soft tissues and spinal canal: No prevertebral fluid or swelling. No visible canal hematoma. Heterogeneously enlarged right lobe of the thyroid extending into the mediastinum posteriorly. In the setting of significant comorbidities or  limited life expectancy, no follow-up recommended (ref: J Am Coll Radiol. 2015 Feb;12(2): 143-50). Disc levels: Stable multilevel degenerative disc disease and facet hypertrophy. Upper chest: Assessed on concurrent chest CT, reported separately. There is a mild T3 superior endplate compression deformity that is partially included. Other: Carotid calcifications. IMPRESSION: 1. No acute fracture or traumatic subluxation of the cervical spine. 2. Mild T3 superior endplate compression deformity is partially included. 3. Stable multilevel degenerative disc disease and facet hypertrophy. Electronically Signed   By: Narda Rutherford M.D.   On: 05/25/2023 18:34   CT CHEST ABDOMEN PELVIS WO CONTRAST  Result Date: 05/25/2023 CLINICAL DATA:  Polytrauma, blunt.  Loss of consciousness, cough EXAM: CT CHEST, ABDOMEN AND PELVIS WITHOUT CONTRAST TECHNIQUE: Multidetector CT imaging of the chest, abdomen and pelvis was performed following the standard protocol without IV contrast. RADIATION DOSE REDUCTION: This exam was performed according to the departmental dose-optimization program which includes automated exposure control, adjustment of the mA and/or kV according to patient size and/or use of iterative reconstruction technique. COMPARISON:  None Available. FINDINGS: CT CHEST FINDINGS Cardiovascular: Diffuse coronary artery and aortic atherosclerosis. Heart is normal size. Aorta is normal caliber. Mediastinum/Nodes: No mediastinal, hilar, or axillary adenopathy. Trachea and esophagus are unremarkable. 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 Lungs/Pleura: Bilateral lower lobe atelectasis. No confluent opacities or effusions. Musculoskeletal: Chest wall soft tissues are unremarkable. No acute bony abnormality. CT ABDOMEN PELVIS FINDINGS Hepatobiliary: Small gallstone layering within the gallbladder. No focal hepatic abnormality or perihepatic hematoma. Pancreas: No focal abnormality or ductal dilatation. Spleen: No  splenic injury or perisplenic hematoma. Adrenals/Urinary Tract: No adrenal hemorrhage or renal injury identified. Bladder is unremarkable. 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 Stomach/Bowel: Sigmoid diverticulosis. No active diverticulitis. Stomach and small bowel decompressed, unremarkable. Vascular/Lymphatic: Diffuse aortoiliac and branch  vessel atherosclerosis. No evidence of aneurysm or adenopathy. Reproductive: Uterus and adnexa unremarkable.  No mass. Other: No free fluid or free air. Musculoskeletal: No acute bony abnormality. IMPRESSION: No acute findings or significant traumatic injury in the chest, abdomen or pelvis. Bibasilar atelectasis. Right thyroid substernal goiter. In the setting of significant comorbidities or limited life expectancy, no follow-up recommended (ref: J Am Coll Radiol. 2015 Feb;12(2): 143-50). Diffuse coronary artery disease, aortic atherosclerosis. Sigmoid diverticulosis. Electronically Signed   By: Charlett Nose M.D.   On: 05/25/2023 18:33   CT Head Wo Contrast  Result Date: 05/25/2023 CLINICAL DATA:  Head trauma, moderate-severe EXAM: CT HEAD WITHOUT CONTRAST TECHNIQUE: Contiguous axial images were obtained from the base of the skull through the vertex without intravenous contrast. RADIATION DOSE REDUCTION: This exam was performed according to the departmental dose-optimization program which includes automated exposure control, adjustment of the mA and/or kV according to patient size and/or use of iterative reconstruction technique. COMPARISON:  Head CT 3 weeks ago 05/04/2023 FINDINGS: Brain: No intracranial hemorrhage, mass effect, or midline shift. No hydrocephalus. The basilar cisterns are patent. Stable atrophy and chronic small vessel ischemic change. No evidence of territorial infarct or acute ischemia. No extra-axial or intracranial fluid collection. Vascular: Atherosclerosis of skullbase vasculature without hyperdense vessel or abnormal calcification.  Skull: No fracture or focal lesion. Sinuses/Orbits: Near complete opacification of right mastoid air cells and partial opacification of left mastoid air cells, unchanged from prior exam no acute findings. Bilateral cataract resection. Other: None. IMPRESSION: 1. No acute intracranial abnormality. No skull fracture. 2. Stable atrophy and chronic small vessel ischemic change. Electronically Signed   By: Narda Rutherford M.D.   On: 05/25/2023 18:29   DG Chest Port 1 View  Result Date: 05/25/2023 CLINICAL DATA:  Questionable sepsis - evaluate for abnormality EXAM: PORTABLE CHEST 1 VIEW COMPARISON:  05/04/2023 FINDINGS: Heart and mediastinal contours within normal limits. Diffuse aortic atherosclerosis. Platelike atelectasis at the right lung base. Left lung clear. No effusions or acute bony abnormality. IMPRESSION: Right base atelectasis. Aortic atherosclerosis. Electronically Signed   By: Charlett Nose M.D.   On: 05/25/2023 17:33    Procedures Procedures    Medications Ordered in ED Medications  lactated ringers bolus 1,000 mL (0 mLs Intravenous Stopped 05/25/23 2003)  fentaNYL (SUBLIMAZE) injection 25 mcg (25 mcg Intravenous Given 05/25/23 1825)  cefTRIAXone (ROCEPHIN) 1 g in sodium chloride 0.9 % 100 mL IVPB (0 g Intravenous Stopped 05/25/23 2136)    ED Course/ Medical Decision Making/ A&P                             Medical Decision Making Amount and/or Complexity of Data Reviewed Labs: ordered. Radiology: ordered. ECG/medicine tests: ordered.  Risk Prescription drug management. Decision regarding hospitalization.   This patient presents to the ED for concern of syncope and altered mental status, this involves an extensive number of treatment options, and is a complaint that carries with it a high risk of complications and morbidity.  The differential diagnosis includes arrhythmia, metabolic derangements, dehydration, infection, CVA, seizures, ICH   Co morbidities that complicate the  patient evaluation  DM, HLD, HTN, GERD, glaucoma   Additional history obtained:  Additional history obtained from patient's daughter External records from outside source obtained and reviewed including EMR   Lab Tests:  I Ordered, and personally interpreted labs.  The pertinent results include: Mild hyponatremia, hypochloremia, and hypocalcemia.  Normal kidney function.  No leukocytosis.  Hemoglobin decreased from 3 weeks ago.  Evidence of UTI on urinalysis.   Imaging Studies ordered:  I ordered imaging studies including x-ray of chest, CT of head, cervical spine, chest, abdomen, pelvis I independently visualized and interpreted imaging which showed bibasilar atelectasis, right thyroid substernal goiter I agree with the radiologist interpretation   Cardiac Monitoring: / EKG:  The patient was maintained on a cardiac monitor.  I personally viewed and interpreted the cardiac monitored which showed an underlying rhythm of: Sinus rhythm  Problem List / ED Course / Critical interventions / Medication management  Patient presents for report of syncope that occurred while undergoing PT at home.  EMS reported that there was not a fall involved.  Family is concerned about a recent cough.  Per chart review, she was seen in the ED on 6/18 following a fall followed by 9 hours of downtime.  During that visit, she was found to have an age-indeterminate nondisplaced fracture mid sacrum.  She was discharged home with daughter.  On arrival, patient's vital signs are normal.  On exam, she is ill-appearing.  She is mildly tremulous.  She is unable to tell me her name.  When asked about pain, she states that she hurts all over.  She is able to move all extremities, but has global weakness.  She is unable/unwilling to open her eyes.  Attempts were made to call both daughter and son.  These calls went unanswered.  Broad workup was initiated.  On reassessment, patient has no change in mental status.  Her  daughter arrived and accompanied her at bedside.  Daughter was able to provide further history.  Lab reasonable for evidence of UTI and urinalysis.  Ceftriaxone was ordered.  Urine cultures were sent.  CT imaging showed incidental finding of goiter.  Her D-dimer was elevated and a CTA of chest was ordered.  MRI was also ordered to further evaluate her encephalopathy.  Patient's daughter confirms the patient is to be DNR/DNI.  Patient was admitted for further management. I ordered medication including fentanyl for analgesia; IV fluids for hydration; ceftriaxone for UTI Reevaluation of the patient after these medicines showed that the patient improved I have reviewed the patients home medicines and have made adjustments as needed   Social Determinants of Health:  Has dementia, lives at home with daughter        Final Clinical Impression(s) / ED Diagnoses Final diagnoses:  Syncope, unspecified syncope type  Encephalopathy    Rx / DC Orders ED Discharge Orders     None         Gloris Manchester, MD 05/25/23 2155    Gloris Manchester, MD 05/26/23 0003

## 2023-05-25 NOTE — ED Notes (Signed)
Warm blankets applied to pt.  

## 2023-05-26 ENCOUNTER — Encounter (HOSPITAL_COMMUNITY): Payer: Self-pay | Admitting: Internal Medicine

## 2023-05-26 ENCOUNTER — Inpatient Hospital Stay (HOSPITAL_COMMUNITY): Payer: Medicare Other

## 2023-05-26 ENCOUNTER — Other Ambulatory Visit (HOSPITAL_COMMUNITY): Payer: Medicare Other

## 2023-05-26 DIAGNOSIS — W19XXXA Unspecified fall, initial encounter: Secondary | ICD-10-CM | POA: Diagnosis present

## 2023-05-26 DIAGNOSIS — Z882 Allergy status to sulfonamides status: Secondary | ICD-10-CM | POA: Diagnosis not present

## 2023-05-26 DIAGNOSIS — Z87440 Personal history of urinary (tract) infections: Secondary | ICD-10-CM | POA: Diagnosis not present

## 2023-05-26 DIAGNOSIS — R7989 Other specified abnormal findings of blood chemistry: Secondary | ICD-10-CM | POA: Diagnosis present

## 2023-05-26 DIAGNOSIS — N390 Urinary tract infection, site not specified: Secondary | ICD-10-CM

## 2023-05-26 DIAGNOSIS — G9341 Metabolic encephalopathy: Secondary | ICD-10-CM | POA: Diagnosis not present

## 2023-05-26 DIAGNOSIS — Y92009 Unspecified place in unspecified non-institutional (private) residence as the place of occurrence of the external cause: Secondary | ICD-10-CM

## 2023-05-26 DIAGNOSIS — E872 Acidosis, unspecified: Secondary | ICD-10-CM | POA: Diagnosis present

## 2023-05-26 DIAGNOSIS — R55 Syncope and collapse: Secondary | ICD-10-CM

## 2023-05-26 DIAGNOSIS — Z7984 Long term (current) use of oral hypoglycemic drugs: Secondary | ICD-10-CM | POA: Diagnosis not present

## 2023-05-26 DIAGNOSIS — E861 Hypovolemia: Secondary | ICD-10-CM | POA: Diagnosis present

## 2023-05-26 DIAGNOSIS — Z1152 Encounter for screening for COVID-19: Secondary | ICD-10-CM | POA: Diagnosis not present

## 2023-05-26 DIAGNOSIS — S3210XA Unspecified fracture of sacrum, initial encounter for closed fracture: Secondary | ICD-10-CM

## 2023-05-26 DIAGNOSIS — N3000 Acute cystitis without hematuria: Secondary | ICD-10-CM

## 2023-05-26 DIAGNOSIS — F0390 Unspecified dementia without behavioral disturbance: Secondary | ICD-10-CM | POA: Diagnosis present

## 2023-05-26 DIAGNOSIS — H3530 Unspecified macular degeneration: Secondary | ICD-10-CM | POA: Diagnosis present

## 2023-05-26 DIAGNOSIS — B961 Klebsiella pneumoniae [K. pneumoniae] as the cause of diseases classified elsewhere: Secondary | ICD-10-CM | POA: Diagnosis present

## 2023-05-26 DIAGNOSIS — E871 Hypo-osmolality and hyponatremia: Secondary | ICD-10-CM | POA: Diagnosis present

## 2023-05-26 DIAGNOSIS — R569 Unspecified convulsions: Secondary | ICD-10-CM

## 2023-05-26 DIAGNOSIS — K219 Gastro-esophageal reflux disease without esophagitis: Secondary | ICD-10-CM | POA: Diagnosis present

## 2023-05-26 DIAGNOSIS — H409 Unspecified glaucoma: Secondary | ICD-10-CM | POA: Diagnosis present

## 2023-05-26 DIAGNOSIS — Y939 Activity, unspecified: Secondary | ICD-10-CM | POA: Diagnosis not present

## 2023-05-26 DIAGNOSIS — E119 Type 2 diabetes mellitus without complications: Secondary | ICD-10-CM | POA: Diagnosis present

## 2023-05-26 DIAGNOSIS — E785 Hyperlipidemia, unspecified: Secondary | ICD-10-CM | POA: Diagnosis present

## 2023-05-26 DIAGNOSIS — Z885 Allergy status to narcotic agent status: Secondary | ICD-10-CM | POA: Diagnosis not present

## 2023-05-26 DIAGNOSIS — E049 Nontoxic goiter, unspecified: Secondary | ICD-10-CM | POA: Diagnosis present

## 2023-05-26 DIAGNOSIS — I10 Essential (primary) hypertension: Secondary | ICD-10-CM | POA: Diagnosis present

## 2023-05-26 DIAGNOSIS — Z66 Do not resuscitate: Secondary | ICD-10-CM | POA: Diagnosis present

## 2023-05-26 DIAGNOSIS — Z888 Allergy status to other drugs, medicaments and biological substances status: Secondary | ICD-10-CM | POA: Diagnosis not present

## 2023-05-26 LAB — GLUCOSE, CAPILLARY
Glucose-Capillary: 94 mg/dL (ref 70–99)
Glucose-Capillary: 125 mg/dL — ABNORMAL HIGH (ref 70–99)

## 2023-05-26 LAB — CBC
HCT: 33 % — ABNORMAL LOW (ref 36.0–46.0)
Hemoglobin: 11.1 g/dL — ABNORMAL LOW (ref 12.0–15.0)
MCH: 31.2 pg (ref 26.0–34.0)
MCHC: 33.6 g/dL (ref 30.0–36.0)
MCV: 92.7 fL (ref 80.0–100.0)
Platelets: 281 10*3/uL (ref 150–400)
RBC: 3.56 MIL/uL — ABNORMAL LOW (ref 3.87–5.11)
RDW: 14.1 % (ref 11.5–15.5)
WBC: 7.3 10*3/uL (ref 4.0–10.5)
nRBC: 0 % (ref 0.0–0.2)

## 2023-05-26 LAB — CBG MONITORING, ED
Glucose-Capillary: 96 mg/dL (ref 70–99)
Glucose-Capillary: 96 mg/dL (ref 70–99)

## 2023-05-26 LAB — COMPREHENSIVE METABOLIC PANEL
ALT: 16 U/L (ref 0–44)
AST: 26 U/L (ref 15–41)
Albumin: 2.5 g/dL — ABNORMAL LOW (ref 3.5–5.0)
Alkaline Phosphatase: 88 U/L (ref 38–126)
Anion gap: 9 (ref 5–15)
BUN: 12 mg/dL (ref 8–23)
CO2: 23 mmol/L (ref 22–32)
Calcium: 7.9 mg/dL — ABNORMAL LOW (ref 8.9–10.3)
Chloride: 95 mmol/L — ABNORMAL LOW (ref 98–111)
Creatinine, Ser: 0.5 mg/dL (ref 0.44–1.00)
GFR, Estimated: >60 mL/min (ref >60)
Glucose, Bld: 106 mg/dL — ABNORMAL HIGH (ref 70–99)
Potassium: 3.3 mmol/L — ABNORMAL LOW (ref 3.5–5.1)
Sodium: 127 mmol/L — ABNORMAL LOW (ref 135–145)
Total Bilirubin: 0.7 mg/dL (ref 0.3–1.2)
Total Protein: 5.6 g/dL — ABNORMAL LOW (ref 6.5–8.1)

## 2023-05-26 LAB — OSMOLALITY, URINE: Osmolality, Ur: 457 mOsm/kg (ref 300–900)

## 2023-05-26 LAB — URINE CULTURE: Culture: >=100000 — AB

## 2023-05-26 LAB — SODIUM, URINE, RANDOM: Sodium, Ur: 18 mmol/L

## 2023-05-26 MED ORDER — ONDANSETRON HCL 4 MG PO TABS: 4.0000 mg | ORAL_TABLET | Freq: Four times a day (QID) | ORAL | Status: DC | PRN

## 2023-05-26 MED ORDER — ENOXAPARIN SODIUM 30 MG/0.3ML IJ SOSY
30.0000 mg | PREFILLED_SYRINGE | INTRAMUSCULAR | Status: DC
  Administered 2023-05-27 – 2023-05-28 (×2): 30 mg via SUBCUTANEOUS
  Filled 2023-05-26 (×2): qty 0.3

## 2023-05-26 MED ORDER — ENOXAPARIN SODIUM 40 MG/0.4ML IJ SOSY
40.0000 mg | PREFILLED_SYRINGE | INTRAMUSCULAR | Status: DC
  Administered 2023-05-26: 40 mg via SUBCUTANEOUS
  Filled 2023-05-26: qty 0.4

## 2023-05-26 MED ORDER — CEPHALEXIN 250 MG PO CAPS
500.0000 mg | ORAL_CAPSULE | Freq: Three times a day (TID) | ORAL | Status: DC
  Administered 2023-05-26: 500 mg via ORAL
  Filled 2023-05-26: qty 2

## 2023-05-26 MED ORDER — ACETAMINOPHEN 10 MG/ML IV SOLN
1000.0000 mg | Freq: Once | INTRAVENOUS | Status: AC
  Administered 2023-05-26: 1000 mg via INTRAVENOUS
  Filled 2023-05-26: qty 100

## 2023-05-26 MED ORDER — SODIUM CHLORIDE 0.9 % IV SOLN
1.0000 g | Freq: Once | INTRAVENOUS | Status: AC
  Administered 2023-05-26: 1 g via INTRAVENOUS
  Filled 2023-05-26: qty 10

## 2023-05-26 MED ORDER — DEXTROSE 10 % IV BOLUS: 250.0000 mL | INTRAVENOUS | Status: DC | PRN

## 2023-05-26 MED ORDER — POTASSIUM CHLORIDE 20 MEQ PO PACK
60.0000 meq | PACK | Freq: Once | ORAL | Status: AC
  Administered 2023-05-26: 60 meq via ORAL
  Filled 2023-05-26: qty 3

## 2023-05-26 MED ORDER — POTASSIUM CHLORIDE CRYS ER 20 MEQ PO TBCR
40.0000 meq | EXTENDED_RELEASE_TABLET | Freq: Once | ORAL | Status: DC
  Filled 2023-05-26: qty 2

## 2023-05-26 MED ORDER — HYDRALAZINE HCL 20 MG/ML IJ SOLN: 10.0000 mg | Freq: Three times a day (TID) | INTRAMUSCULAR | Status: DC | PRN

## 2023-05-26 MED ORDER — SODIUM CHLORIDE 0.9 % IV SOLN: 1.0000 g | INTRAVENOUS | Status: DC

## 2023-05-26 MED ORDER — IBUPROFEN 400 MG PO TABS: 400.0000 mg | ORAL_TABLET | Freq: Four times a day (QID) | ORAL | Status: DC | PRN

## 2023-05-26 MED ORDER — ONDANSETRON HCL 4 MG/2ML IJ SOLN: 4.0000 mg | Freq: Four times a day (QID) | INTRAMUSCULAR | Status: DC | PRN

## 2023-05-26 MED ORDER — SODIUM CHLORIDE 0.9% FLUSH: 3.0000 mL | INTRAVENOUS | Status: DC | PRN

## 2023-05-26 MED ORDER — GLUCERNA SHAKE PO LIQD
237.0000 mL | Freq: Two times a day (BID) | ORAL | Status: DC
  Administered 2023-05-26 – 2023-05-28 (×5): 237 mL via ORAL
  Filled 2023-05-26: qty 237

## 2023-05-26 MED ORDER — GLUCOSE 40 % PO GEL: 1.0000 | Freq: Once | ORAL | Status: DC | PRN

## 2023-05-26 MED ORDER — SODIUM CHLORIDE 0.9% FLUSH
3.0000 mL | Freq: Two times a day (BID) | INTRAVENOUS | Status: DC
  Administered 2023-05-26 – 2023-05-28 (×5): 3 mL via INTRAVENOUS

## 2023-05-26 MED ORDER — SODIUM CHLORIDE 0.9 % IV SOLN: 250.0000 mL | INTRAVENOUS | Status: DC | PRN

## 2023-05-26 MED ORDER — ACETAMINOPHEN 500 MG PO TABS
1000.0000 mg | ORAL_TABLET | Freq: Three times a day (TID) | ORAL | Status: DC
  Administered 2023-05-26 – 2023-05-28 (×2): 1000 mg via ORAL
  Filled 2023-05-26 (×5): qty 2

## 2023-05-26 MED ORDER — SODIUM CHLORIDE 0.9 % IV SOLN: INTRAVENOUS | Status: DC

## 2023-05-26 MED ORDER — IBUPROFEN 400 MG PO TABS
400.0000 mg | ORAL_TABLET | Freq: Four times a day (QID) | ORAL | Status: DC | PRN
  Administered 2023-05-26: 400 mg via ORAL
  Filled 2023-05-26: qty 1

## 2023-05-26 NOTE — Evaluation (Signed)
Clinical/Bedside Swallow Evaluation Patient Details  Name: Donna Sanford MRN: [id number] Date of Birth: [birth year]  Today's Date: 05/26/2023 Time: SLP Start Time (ACUTE ONLY): 1145 SLP Stop Time (ACUTE ONLY): 1203 SLP Time Calculation (min) (ACUTE ONLY): 18 min  Past Medical History:  Past Medical History:  Diagnosis Date   Diabetes mellitus without complication (HCC)    GERD (gastroesophageal reflux disease)    Glaucoma    Hyperlipidemia    Hypertension    Macular degeneration    Past Surgical History: History reviewed. No pertinent surgical history. HPI:  89 y.o. female presents to [facility] hospital on 05/25/2023 after syncopal episode and fall at home. Stroke has been ruled out. She has UTI. Pt with recent sacral fx ~2 weeks ago and has been having difficulty mobilizing since this time. PMH includes DM, HLD, HTN, GERD, glaucoma. Per notes, pt has had difficulty swallowing solid food.    Assessment / Plan / Recommendation  Clinical Impression  Pt participated in clinical swallowing assessment. Oral mechanism exam was normal. Her daughter was at the bedside and described changes in mastication (slower, repetitive) the last few months.  Today, mastication/oral manipulation was prolonged but functional.  She demonstrated good attention to straw and drank thin liquids with no s/s of aspiration. Recommend she continue on current diet to allow for variety of options. Give meds in water; if coughing, provide whole in puree.  No SLP f/u is needed. Our service will sign off. SLP Visit Diagnosis: Dysphagia, unspecified (R13.10)    Aspiration Risk  No limitations    Diet Recommendation   Thin;Age appropriate regular  Medication Administration: Whole meds with liquid    Other  Recommendations Oral Care Recommendations: Oral care BID    Recommendations for follow up therapy are one component of a multi-disciplinary discharge planning process, led by the attending physician.  Recommendations may be  updated based on patient status, additional functional criteria and insurance authorization.  Follow up Recommendations No SLP follow up                          Prognosis        Swallow Study   General Date of Onset: 05/25/23 HPI: 89 y.o. female presents to [facility] hospital on 05/25/2023 after syncopal episode and fall at home. Stroke has been ruled out. She has UTI. Pt with recent sacral fx ~2 weeks ago and has been having difficulty mobilizing since this time. PMH includes DM, HLD, HTN, GERD, glaucoma. Per notes, pt has had difficulty swallowing solid food. Type of Study: Bedside Swallow Evaluation Previous Swallow Assessment: no Diet Prior to this Study: Regular;Thin liquids (Level 0) Temperature Spikes Noted: No History of Recent Intubation: No Behavior/Cognition: Alert Oral Cavity Assessment: Within Functional Limits Oral Care Completed by SLP: No Oral Cavity - Dentition: Adequate natural dentition Self-Feeding Abilities: Needs assist Patient Positioning: Upright in bed Baseline Vocal Quality: Normal Volitional Cough: Strong Volitional Swallow: Able to elicit    Oral/Motor/Sensory Function Overall Oral Motor/Sensory Function: Within functional limits   Ice Chips Ice chips: Not tested   Thin Liquid Thin Liquid: Within functional limits    Nectar Thick Nectar Thick Liquid: Not tested   Honey Thick Honey Thick Liquid: Not tested   Puree Puree: Impaired Presentation: Spoon Oral Phase Functional Implications: Prolonged oral transit   Solid     Solid: Not tested      Blenda Mounts Laurice 05/26/2023,12:10 PM   Marchelle Folks L. Samson Frederic, MA CCC/SLP Clinical Specialist -  Acute Care SLP Acute Rehabilitation Services Office number [phone]

## 2023-05-26 NOTE — TOC Initial Note (Signed)
Transition of Care [facility]) - Initial/Assessment Note    Patient Details  Name: Donna Sanford MRN: [id number] Date of Birth: 10/07/1925  Transition of Care [facility]) CM/SW Contact:    Ralene Bathe, LCSW Phone Number: 05/26/2023, 4:22 PM  Clinical Narrative:                 CSW received consult for possible SNF placement at time of discharge. CSW spoke with patient's daughter, Ginger, as patient is only oriented to person. Patient's family expressed understanding of PT recommendation and is agreeable to SNF placement at time of discharge. The family reports preference for [facility]. CSW discussed insurance authorization process and will provide Medicare SNF ratings list. CSW will send out referrals for review and provide bed offers as available.   Skilled Nursing Rehab Facilities-   [email or web address]   Ratings out of 5 stars (5 the highest)   Name Address  Phone # Quality Care Staffing Health Inspection Overall  [facility] [address], [place] [phone] 2 1 5 4   [facility] [address], [place] [phone] 4 1 3 2   [facility] [address], [place] [phone] 2 1 1 1   [facility] [address] [place] [phone] 4 1 3 2   [facility]  [address], [place] [phone] 3 2 5 5   [facility] [address], [facility] [phone] 2 1 2 1   [facility] [address] [place] [phone] 4 1 2 1   [facility] [id number] [place] [phone] 2 4 3 3   [address] ([facility]) [address] [place] [phone] 3 2 2 2   [facility] [address] [place], [place] [phone] 1 2 1 1   [facility] [address], [place] [phone] 3 1 1 1   [facility] [address] [place]iane [place] [phone] 4 3 4 4   [facility] [address] [place] [phone] 3 4 3 3           [facility] [address], [place] [phone]      [facility] [address] [place] [address], [place] [phone] 1 1 2 1   [facility] [address] [place] [phone] 2 2 4 4   [facility] [place] [address] [phone] 2 1 4 3   [facility] [address] [place] [phone] 3 3 3 3           [address] (no [facility]) [address], [place] [phone] 4 4 5 5   [facility] (No [employer]) [address] [place] [phone] 2 2 4 4   [facility] [address] [place] [phone] 2 1 2 1   [facility] (No [employer]) [address], [place] [phone] 5 5 5 5   [facility] [address] [facility] [phone] 2 3 5 5   [facility] [address], [place] [phone] 2 1 1 1   [facility] [address] [place] [phone] 5 2 5 5   [facility]  [address] [place] [phone] 2 2 2 2   [facility] [address] [place] [phone] 4 2 1 1   [facility] [address] [place], [place] [phone] 2 2 3 3           [facility] [address] [place] [phone] 1 1 1 1   [facility] [address] [place]lyn Clines  [phone] 2 3 3 3   [facility] (No Humana) [address] [place] [phone] 2 1 3  2   [facility]) [address], [place] [phone] 1 1 1 1   [facility] [address], [place] [phone] 3 2 5 5   [facility] [address], [place] [phone] 2 1 1 1           [facility] [address] [place], [place] [phone] 4 4 5 5   [facility])  [address] [place] [phone] 2 1 2 1   [facility]) [address] [place] [phone]  1 4 3   [facility] [address], [place] [phone] 3 5 4 5   [address] [place], [place] [phone] 3 2 2 2   [facility]) [address] [place] [phone]  2 1 3 2     Expected Discharge Plan: Skilled Nursing Facility Barriers to Discharge: Insurance Authorization, Continued Medical Work up, SNF Pending bed offer   Patient Goals and CMS Choice   CMS Medicare.gov Compare Post Acute Care list provided to:: Patient Represenative (must comment) Choice offered to / list presented to : Adult Children      Expected Discharge Plan and Services In-house Referral: Clinical Social Work     Living arrangements for the past 2 months: Single Family Home                                      Prior Living Arrangements/Services Living arrangements for the past 2 months: Single Family Home Lives with:: Adult Children Patient language and need for interpreter reviewed:: Yes Do you feel safe going back to the place where you live?: Yes      Need for Family Participation in Patient Care: Yes (Comment) Care giver support system in place?: Yes (comment)   Criminal Activity/Legal Involvement Pertinent to Current Situation/Hospitalization: No - Comment as needed  Activities of Daily Living Home Assistive Devices/Equipment: Cane (specify quad or straight), Shower chair with back ADL Screening (condition at time of admission) Patient's cognitive ability adequate to safely complete daily activities?: No Is the patient deaf or have difficulty hearing?: No Does the patient have difficulty seeing, even when wearing glasses/contacts?: No Does the patient have difficulty concentrating, remembering, or making decisions?: Yes Patient able to express need for assistance with ADLs?: Yes Does the patient have difficulty dressing or bathing?: Yes Independently performs ADLs?: No Communication: Independent Dressing (OT): Needs assistance Is this a change from baseline?: Pre-admission baseline Feeding: Independent Bathing: Dependent Is this a change from baseline?: Pre-admission baseline Toileting: Dependent Is this a change from baseline?: Change from  baseline, expected to last >3days In/Out Bed: Dependent Is this a change from baseline?: Change from baseline, expected to last >3 days Walks in Home: Dependent Is this a change from baseline?: Change from baseline, expected to last >3 days Does the patient have difficulty walking or climbing stairs?: Yes Weakness of Legs: Both Weakness of Arms/Hands: Both  Permission Sought/Granted   Permission granted to share information with : Yes, Verbal Permission Granted (granted via patient's daughter Ginger as patient is only oriented to person)     Permission granted to share info w AGENCY: SNFs        Emotional Assessment       Orientation: : Oriented to Self Alcohol / Substance Use: Not Applicable Psych Involvement: No (comment)  Admission diagnosis:  Vasovagal syncope [R55] Encephalopathy [G93.40] Syncope, unspecified syncope type [R55] Patient Active  Problem List   Diagnosis Date Noted   Syncope 05/26/2023   UTI (urinary tract infection) 05/26/2023   Acute metabolic encephalopathy 05/26/2023   Diabetes mellitus type 2, controlled (HCC) 05/26/2023   Fall at home, initial encounter 05/26/2023   Hyponatremia 05/26/2023   Closed compression fracture of sacrum (HCC) 05/26/2023   HLD (hyperlipidemia)    Hypertension    PCP:  Blair Heys, MD Pharmacy:   [facility] - [place], [place] - [address] [place] [address] Phone: [phone] Fax: [phone]  [id number] [place], [place] - [address] [place] [address] Phone: [phone] Fax: [phone]     Social Determinants of Health (SDOH) Social History: SDOH Screenings   Food Insecurity: No Food Insecurity (05/26/2023)  Housing: Low Risk  (05/26/2023)  Transportation Needs: No Transportation Needs (05/26/2023)  Utilities: Not At Risk (05/26/2023)  Tobacco Use: Unknown (05/26/2023)   SDOH Interventions:      Readmission Risk Interventions     No data to display

## 2023-05-26 NOTE — ED Notes (Signed)
ED TO INPATIENT HANDOFF REPORT  ED Nurse Name and Phone #: Jesse Sans, [phone]  S Name/Age/Gender Donna Sanford 89 y.o. female Room/Bed: 038C/038C  Code Status   Code Status: DNR  Home/SNF/Other Home Patient oriented to: self Is this baseline? Yes   Triage Complete: Triage complete  Chief Complaint Vasovagal syncope [R55]  Triage Note Pt BIB EMS from home for a syncopal episode during PT. Family reports to EMS that the pt did not fall. Currently alert to baseline. Family concerned about pt having a cough for the past couple days. Pt has broken tail bone from prior fall on 6/15. EMS VS:  116/54 HR 66 97% RA CBG 160   Allergies Allergies  Allergen Reactions   Codeine Other (See Comments)    Other reaction(s): anxiety   Fosamax [Alendronate Sodium]    Lisinopril Other (See Comments)    Other reaction(s): Cough   Other Other (See Comments)   Raloxifene Other (See Comments)   Sulfa Antibiotics Other (See Comments)    Level of Care/Admitting Diagnosis ED Disposition     ED Disposition  Admit   Condition  --   Comment  Hospital Area: [facility] [[id number]]  Level of Care: Telemetry Medical [104]  May admit patient to [facility] or [facility] if equivalent level of care is available:: No  Covid Evaluation: Asymptomatic - no recent exposure (last 10 days) testing not required  Diagnosis: Vasovagal syncope [147829]  Admitting Physician: Tereasa Coop [[id number]]  Attending Physician: Tereasa Coop [[id number]]  Certification:: I certify this patient will need inpatient services for at least 2 midnights  Estimated Length of Stay: 5          B Medical/Surgery History Past Medical History:  Diagnosis Date   Diabetes mellitus without complication (HCC)    GERD (gastroesophageal reflux disease)    Glaucoma    Hyperlipidemia    Hypertension    Macular degeneration    History reviewed. No pertinent surgical history.   A IV  Location/Drains/Wounds Patient Lines/Drains/Airways Status     Active Line/Drains/Airways     Name Placement date Placement time Site Days   Peripheral IV 05/25/23 20 G Anterior;Right Forearm 05/25/23  1744  Forearm  1            Intake/Output Last 24 hours  Intake/Output Summary (Last 24 hours) at 05/26/2023 1233 Last data filed at 05/26/2023 0845 Gross per 24 hour  Intake 1063.33 ml  Output --  Net 1063.33 ml    Labs/Imaging Results for orders placed or performed during the hospital encounter of 05/25/23 (from the past 48 hour(s))  Comprehensive metabolic panel     Status: Abnormal   Collection Time: 05/25/23  5:13 PM  Result Value Ref Range   Sodium 129 (L) 135 - 145 mmol/L   Potassium 3.9 3.5 - 5.1 mmol/L   Chloride 91 (L) 98 - 111 mmol/L   CO2 23 22 - 32 mmol/L   Glucose, Bld 133 (H) 70 - 99 mg/dL    Comment: Glucose reference range applies only to samples taken after fasting for at least 8 hours.   BUN 16 8 - 23 mg/dL   Creatinine, Ser 6.29 0.44 - 1.00 mg/dL   Calcium 7.8 (L) 8.9 - 10.3 mg/dL   Total Protein 5.9 (L) 6.5 - 8.1 g/dL   Albumin 2.7 (L) 3.5 - 5.0 g/dL   AST 31 15 - 41 U/L   ALT 15 0 - 44 U/L   Alkaline Phosphatase 91  38 - 126 U/L   Total Bilirubin 0.7 0.3 - 1.2 mg/dL   GFR, Estimated >65 >78 mL/min    Comment: (NOTE) Calculated using the CKD-EPI Creatinine Equation (2021)    Anion gap 15 5 - 15    Comment: Performed at [facility], [address], [place], [place] [address]  CBC with Differential     Status: Abnormal   Collection Time: 05/25/23  5:13 PM  Result Value Ref Range   WBC 7.3 4.0 - 10.5 K/uL   RBC 3.40 (L) 3.87 - 5.11 MIL/uL   Hemoglobin 10.6 (L) 12.0 - 15.0 g/dL   HCT 95.2 (L) 84.1 - 32.4 %   MCV 92.4 80.0 - 100.0 fL   MCH 31.2 26.0 - 34.0 pg   MCHC 33.8 30.0 - 36.0 g/dL   RDW 40.1 02.7 - 25.3 %   Platelets 279 150 - 400 K/uL   nRBC 0.0 0.0 - 0.2 %   Neutrophils Relative % 72 %   Neutro Abs 5.3 1.7 - 7.7 K/uL    Lymphocytes Relative 20 %   Lymphs Abs 1.4 0.7 - 4.0 K/uL   Monocytes Relative 7 %   Monocytes Absolute 0.5 0.1 - 1.0 K/uL   Eosinophils Relative 1 %   Eosinophils Absolute 0.1 0.0 - 0.5 K/uL   Basophils Relative 0 %   Basophils Absolute 0.0 0.0 - 0.1 K/uL   Immature Granulocytes 0 %   Abs Immature Granulocytes 0.03 0.00 - 0.07 K/uL    Comment: Performed at [facility], [address] [place] [address], [place], [place] [address]  Protime-INR     Status: None   Collection Time: 05/25/23  5:13 PM  Result Value Ref Range   Prothrombin Time 13.3 11.4 - 15.2 seconds   INR 1.0 0.8 - 1.2    Comment: (NOTE) INR goal varies based on device and disease states. Performed at [facility], [address] [place], [place] [address]   Brain natriuretic peptide     Status: None   Collection Time: 05/25/23  5:13 PM  Result Value Ref Range   B Natriuretic Peptide 29.4 0.0 - 100.0 pg/mL    Comment: Performed at [facility], [address], [place], [place] [address]  Troponin I (High Sensitivity)     Status: None   Collection Time: 05/25/23  5:13 PM  Result Value Ref Range   Troponin I (High Sensitivity) 8 <18 ng/L    Comment: (NOTE) Elevated high sensitivity troponin I (hsTnI) values and significant  changes across serial measurements may suggest ACS but many other  chronic and acute conditions are known to elevate hsTnI results.  Refer to the "Links" section for chest pain algorithms and additional  guidance. Performed at [facility], [address] [place], [place] [address]   Magnesium     Status: None   Collection Time: 05/25/23  5:13 PM  Result Value Ref Range   Magnesium 1.7 1.7 - 2.4 mg/dL    Comment: Performed at [facility], [address] [place], [place] [address]  D-dimer, quantitative     Status: Abnormal   Collection Time: 05/25/23  5:13 PM  Result Value Ref Range   D-Dimer, Quant 1.24 (H) 0.00 - 0.50 ug/mL-FEU    Comment: (NOTE) At the manufacturer  cut-off value of 0.5 g/mL FEU, this assay has a negative predictive value of 95-100%.This assay is intended for use in conjunction with a clinical pretest probability (  PTP) assessment model to exclude pulmonary embolism (PE) and deep venous thrombosis (DVT) in outpatients suspected of PE or DVT. Results should be correlated with clinical presentation. Performed at [facility], [address], [place], [place] [address]   POC CBG, ED     Status: Abnormal   Collection Time: 05/25/23  5:26 PM  Result Value Ref Range   Glucose-Capillary 147 (H) 70 - 99 mg/dL    Comment: Glucose reference range applies only to samples taken after fasting for at least 8 hours.  Lactic acid, plasma     Status: Abnormal   Collection Time: 05/25/23  5:30 PM  Result Value Ref Range   Lactic Acid, Venous 2.8 (HH) 0.5 - 1.9 mmol/L    Comment: CRITICAL RESULT CALLED TO, READ BACK BY AND VERIFIED WITH A JAMES,RN 1836 05/25/2023 WBOND Performed at [facility], [address] [place], [place] [address]   Blood Culture (routine x 2)     Status: None (Preliminary result)   Collection Time: 05/25/23  5:30 PM   Specimen: BLOOD  Result Value Ref Range   Specimen Description BLOOD SITE NOT SPECIFIED    Special Requests      BOTTLES DRAWN AEROBIC AND ANAEROBIC Blood Culture adequate volume   Culture      NO GROWTH < 12 HOURS Performed at [facility], [address], [place], [place] [address]    Report Status PENDING   Urinalysis, w/ Reflex to Culture (Infection Suspected) -Urine, Clean Catch     Status: Abnormal   Collection Time: 05/25/23  5:41 PM  Result Value Ref Range   Specimen Source URINE, CLEAN CATCH    Color, Urine YELLOW YELLOW   APPearance HAZY (A) CLEAR   Specific Gravity, Urine 1.011 1.005 - 1.030   pH 7.0 5.0 - 8.0   Glucose, UA NEGATIVE NEGATIVE mg/dL   Hgb urine dipstick NEGATIVE NEGATIVE   Bilirubin Urine NEGATIVE NEGATIVE   Ketones, ur 5 (A) NEGATIVE mg/dL   Protein, ur  NEGATIVE NEGATIVE mg/dL   Nitrite NEGATIVE NEGATIVE   Leukocytes,Ua LARGE (A) NEGATIVE   RBC / HPF 0-5 0 - 5 RBC/hpf   WBC, UA 21-50 0 - 5 WBC/hpf    Comment:        Reflex urine culture not performed if WBC <=10, OR if Squamous epithelial cells >5. If Squamous epithelial cells >5 suggest recollection.    Bacteria, UA FEW (A) NONE SEEN   Squamous Epithelial / HPF 0-5 0 - 5 /HPF    Comment: Performed at [facility], [address], [place], [place] [address]  Urine Culture     Status: None (Preliminary result)   Collection Time: 05/25/23  5:41 PM   Specimen: Urine, Clean Catch  Result Value Ref Range   Specimen Description URINE, CLEAN CATCH    Special Requests      NONE Reflexed from [id number] Performed at [facility], [address], [place], [place] [address]    Culture PENDING    Report Status PENDING   I-Stat Chem 8, ED     Status: Abnormal   Collection Time: 05/25/23  5:56 PM  Result Value Ref Range   Sodium 128 (L) 135 - 145 mmol/L   Potassium 3.8 3.5 - 5.1 mmol/L   Chloride 94 (L) 98 - 111 mmol/L   BUN 15 8 - 23 mg/dL   Creatinine, Ser 7.84 0.44 - 1.00 mg/dL   Glucose, Bld 696 (H)  70 - 99 mg/dL    Comment: Glucose reference range applies only to samples taken after fasting for at least 8 hours.   Calcium, Ion 0.86 (LL) 1.15 - 1.40 mmol/L   TCO2 23 22 - 32 mmol/L   Hemoglobin 11.6 (L) 12.0 - 15.0 g/dL   HCT 40.9 (L) 81.1 - 91.4 %   Comment NOTIFIED PHYSICIAN   I-Stat venous blood gas, ED ([facility])     Status: Abnormal   Collection Time: 05/25/23  5:57 PM  Result Value Ref Range   pH, Ven 7.554 (H) 7.25 - 7.43   pCO2, Ven 26.5 (L) 44 - 60 mmHg   pO2, Ven 98 (H) 32 - 45 mmHg   Bicarbonate 23.4 20.0 - 28.0 mmol/L   TCO2 24 22 - 32 mmol/L   O2 Saturation 99 %   Acid-Base Excess 2.0 0.0 - 2.0 mmol/L   Sodium 128 (L) 135 - 145 mmol/L   Potassium 3.8 3.5 - 5.1 mmol/L   Calcium, Ion 0.89 (LL) 1.15 - 1.40 mmol/L   HCT 35.0 (L) 36.0 - 46.0 %   Hemoglobin  11.9 (L) 12.0 - 15.0 g/dL   Sample type VENOUS    Comment NOTIFIED PHYSICIAN   Resp panel by RT-PCR (RSV, Flu A&B, Covid) Anterior Nasal Swab     Status: None   Collection Time: 05/25/23  6:15 PM   Specimen: Anterior Nasal Swab  Result Value Ref Range   SARS Coronavirus 2 by RT PCR NEGATIVE NEGATIVE   Influenza A by PCR NEGATIVE NEGATIVE   Influenza B by PCR NEGATIVE NEGATIVE    Comment: (NOTE) The Xpert Xpress SARS-CoV-2/FLU/RSV plus assay is intended as an aid in the diagnosis of influenza from Nasopharyngeal swab specimens and should not be used as a sole basis for treatment. Nasal washings and aspirates are unacceptable for Xpert Xpress SARS-CoV-2/FLU/RSV testing.  Fact Sheet for Patients: BloggerCourse.com  Fact Sheet for Healthcare Providers: SeriousBroker.it  This test is not yet approved or cleared by the [place] FDA and has been authorized for detection and/or diagnosis of SARS-CoV-2 by FDA under an Emergency Use Authorization (EUA). This EUA will remain in effect (meaning this test can be used) for the duration of the COVID-19 declaration under Section 564(b)(1) of the Act, 21 U.S.C. section 360bbb-3(b)(1), unless the authorization is terminated or revoked.     Resp Syncytial Virus by PCR NEGATIVE NEGATIVE    Comment: (NOTE) Fact Sheet for Patients: BloggerCourse.com  Fact Sheet for Healthcare Providers: SeriousBroker.it  This test is not yet approved or cleared by the [place] FDA and has been authorized for detection and/or diagnosis of SARS-CoV-2 by FDA under an Emergency Use Authorization (EUA). This EUA will remain in effect (meaning this test can be used) for the duration of the COVID-19 declaration under Section 564(b)(1) of the Act, 21 U.S.C. section 360bbb-3(b)(1), unless the authorization is terminated or revoked.  Performed at [facility], [address], [place], [place] [address]   Blood Culture (routine x 2)     Status: None (Preliminary result)   Collection Time: 05/25/23  6:23 PM   Specimen: BLOOD  Result Value Ref Range   Specimen Description BLOOD SITE NOT SPECIFIED    Special Requests      BOTTLES DRAWN AEROBIC AND ANAEROBIC Blood Culture adequate volume   Culture      NO GROWTH < 12 HOURS Performed at [facility], [address], [place], [place] [address]    Report  Status PENDING   POC occult blood, ED     Status: None   Collection Time: 05/25/23  6:29 PM  Result Value Ref Range   Fecal Occult Bld NEGATIVE NEGATIVE  Lactic acid, plasma     Status: None   Collection Time: 05/25/23  8:00 PM  Result Value Ref Range   Lactic Acid, Venous 1.4 0.5 - 1.9 mmol/L    Comment: Performed at [facility], [address], [place], [place] [address]  Troponin I (High Sensitivity)     Status: None   Collection Time: 05/25/23  8:00 PM  Result Value Ref Range   Troponin I (High Sensitivity) 8 <18 ng/L    Comment: (NOTE) Elevated high sensitivity troponin I (hsTnI) values and significant  changes across serial measurements may suggest ACS but many other  chronic and acute conditions are known to elevate hsTnI results.  Refer to the "Links" section for chest pain algorithms and additional  guidance. Performed at [facility], [address] [place], [place] [address]   TSH     Status: None   Collection Time: 05/25/23  8:00 PM  Result Value Ref Range   TSH 1.185 0.350 - 4.500 uIU/mL    Comment: Performed by a 3rd Generation assay with a functional sensitivity of <=0.01 uIU/mL. Performed at [facility], [address], [place], [place] [address]   Comprehensive metabolic panel     Status: Abnormal   Collection Time: 05/26/23  3:47 AM  Result Value Ref Range   Sodium 127 (L) 135 - 145 mmol/L   Potassium 3.3 (L) 3.5 - 5.1 mmol/L   Chloride 95 (L) 98 - 111 mmol/L   CO2 23 22 - 32  mmol/L   Glucose, Bld 106 (H) 70 - 99 mg/dL    Comment: Glucose reference range applies only to samples taken after fasting for at least 8 hours.   BUN 12 8 - 23 mg/dL   Creatinine, Ser 2.13 0.44 - 1.00 mg/dL   Calcium 7.9 (L) 8.9 - 10.3 mg/dL   Total Protein 5.6 (L) 6.5 - 8.1 g/dL   Albumin 2.5 (L) 3.5 - 5.0 g/dL   AST 26 15 - 41 U/L   ALT 16 0 - 44 U/L   Alkaline Phosphatase 88 38 - 126 U/L   Total Bilirubin 0.7 0.3 - 1.2 mg/dL   GFR, Estimated >08 >65 mL/min    Comment: (NOTE) Calculated using the CKD-EPI Creatinine Equation (2021)    Anion gap 9 5 - 15    Comment: Performed at [facility], [address] [place], [place] [address]  CBC     Status: Abnormal   Collection Time: 05/26/23  3:47 AM  Result Value Ref Range   WBC 7.3 4.0 - 10.5 K/uL   RBC 3.56 (L) 3.87 - 5.11 MIL/uL   Hemoglobin 11.1 (L) 12.0 - 15.0 g/dL   HCT 62.9 (L) 52.8 - 41.3 %   MCV 92.7 80.0 - 100.0 fL   MCH 31.2 26.0 - 34.0 pg   MCHC 33.6 30.0 - 36.0 g/dL   RDW 24.4 01.0 - 27.2 %   Platelets 281 150 - 400 K/uL   nRBC 0.0 0.0 - 0.2 %    Comment: Performed at [facility], [address], [place], [place] [address]  CBG monitoring, ED     Status: None   Collection Time: 05/26/23  8:42 AM  Result Value Ref Range   Glucose-Capillary  96 70 - 99 mg/dL    Comment: Glucose reference range applies only to samples taken after fasting for at least 8 hours.  CBG monitoring, ED     Status: None   Collection Time: 05/26/23 12:03 PM  Result Value Ref Range   Glucose-Capillary 96 70 - 99 mg/dL    Comment: Glucose reference range applies only to samples taken after fasting for at least 8 hours.   EEG adult  Result Date: 05/26/2023 Charlsie Quest, MD     05/26/2023 10:47 AM Patient Name: Addy Mcmannis MRN: [id number] Epilepsy Attending: Charlsie Quest Referring Physician/Provider: Gloris Manchester, MD Date: 05/26/2023 Duration: 23 mins Patient history: 89yo F with ams getting eeg to evaluate for seizure.  Level of alertness: Awake AEDs during EEG study: None Technical aspects: This EEG study was done with scalp electrodes positioned according to the 10-20 International system of electrode placement. Electrical activity was reviewed with band pass filter of 1-70Hz , sensitivity of 7 uV/mm, display speed of 76mm/sec with a 60Hz  notched filter applied as appropriate. EEG data were recorded continuously and digitally stored.  Video monitoring was available and reviewed as appropriate. Description: The posterior dominant rhythm consists of 9 Hz activity of moderate voltage (25-35 uV) seen predominantly in posterior head regions, symmetric and reactive to eye opening and eye closing. Hyperventilation and photic stimulation were not performed.   IMPRESSION: This study is within normal limits. No seizures or epileptiform discharges were seen throughout the recording. A normal interictal EEG does not exclude the diagnosis of epilepsy. Charlsie Quest   MR Brain W and Wo Contrast  Result Date: 05/26/2023 CLINICAL DATA:  Altered mental status EXAM: MRI HEAD WITHOUT AND WITH CONTRAST TECHNIQUE: Multiplanar, multiecho pulse sequences of the brain and surrounding structures were obtained without and with intravenous contrast. CONTRAST:  5mL GADAVIST GADOBUTROL 1 MMOL/ML IV SOLN COMPARISON:  12/25/2017 MRI head, correlation is also made with 05/25/2023 CT head FINDINGS: Evaluation is limited by motion. Brain: No restricted diffusion to suggest acute or subacute infarct. No abnormal parenchymal or meningeal enhancement. No acute hemorrhage, mass, mass effect, or midline shift. No hydrocephalus or extra-axial collection. Normal pituitary and craniocervical junction. No hemosiderin deposition to suggest remote hemorrhage. Normal cerebral volume for age. Dilated perivascular spaces in the basal ganglia. T2 hyperintense signal in the periventricular white matter, likely the sequela of mild chronic small vessel ischemic disease.  Vascular: Normal arterial flow voids. Normal arterial and venous enhancement. Skull and upper cervical spine: Normal marrow signal. Sinuses/Orbits: Clear paranasal sinuses. Status post bilateral lens replacements. Other: Fluid in the right-greater-than-left mastoid air cells. IMPRESSION: No acute intracranial process. No evidence of acute or subacute infarct. Electronically Signed   By: Wiliam Ke M.D.   On: 05/26/2023 00:10   CT Angio Chest PE W and/or Wo Contrast  Result Date: 05/25/2023 CLINICAL DATA:  Pulmonary embolism (PE) suspected, low to intermediate prob, positive D-dimer. Syncope. EXAM: CT ANGIOGRAPHY CHEST WITH CONTRAST TECHNIQUE: Multidetector CT imaging of the chest was performed using the standard protocol during bolus administration of intravenous contrast. Multiplanar CT image reconstructions and MIPs were obtained to evaluate the vascular anatomy. RADIATION DOSE REDUCTION: This exam was performed according to the departmental dose-optimization program which includes automated exposure control, adjustment of the mA and/or kV according to patient size and/or use of iterative reconstruction technique. CONTRAST:  75mL OMNIPAQUE IOHEXOL 350 MG/ML SOLN COMPARISON:  05/25/2023 FINDINGS: Cardiovascular: No filling defects in the pulmonary arteries to suggest pulmonary emboli. Diffuse coronary artery  and aortic atherosclerosis. Heart enlarged. Aorta normal caliber. Mediastinum/Nodes: No mediastinal, hilar, or axillary adenopathy. Trachea and esophagus are unremarkable. Substernal thyroid goiter. Lungs/Pleura: Mucous plugging in the airways in the lower lobes bilaterally, right greater than left. Bibasilar atelectasis. No effusions. Upper Abdomen: No acute findings Musculoskeletal: Chest wall soft tissues are unremarkable. No acute bony abnormality. Review of the MIP images confirms the above findings. IMPRESSION: No evidence of pulmonary embolus. Cardiomegaly, coronary artery disease. Mucous plugging  within the lower lobe airways bilaterally with bibasilar atelectasis. Substernal thyroid goiter. In the setting of significant comorbidities or limited life expectancy, no follow-up recommended (ref: J Am Coll Radiol. 2015 Feb;12(2): 143-50). Aortic Atherosclerosis (ICD10-I70.0). Electronically Signed   By: Charlett Nose M.D.   On: 05/25/2023 23:24   CT Cervical Spine Wo Contrast  Result Date: 05/25/2023 CLINICAL DATA:  Neck trauma (Age >= 65y) EXAM: CT CERVICAL SPINE WITHOUT CONTRAST TECHNIQUE: Multidetector CT imaging of the cervical spine was performed without intravenous contrast. Multiplanar CT image reconstructions were also generated. RADIATION DOSE REDUCTION: This exam was performed according to the departmental dose-optimization program which includes automated exposure control, adjustment of the mA and/or kV according to patient size and/or use of iterative reconstruction technique. COMPARISON:  Cervical spine CT 3 weeks ago 05/04/2023 FINDINGS: Alignment: No traumatic subluxation. Skull base and vertebrae: No acute fracture. The skull base and dens are intact. Soft tissues and spinal canal: No prevertebral fluid or swelling. No visible canal hematoma. Heterogeneously enlarged right lobe of the thyroid extending into the mediastinum posteriorly. In the setting of significant comorbidities or limited life expectancy, no follow-up recommended (ref: J Am Coll Radiol. 2015 Feb;12(2): 143-50). Disc levels: Stable multilevel degenerative disc disease and facet hypertrophy. Upper chest: Assessed on concurrent chest CT, reported separately. There is a mild T3 superior endplate compression deformity that is partially included. Other: Carotid calcifications. IMPRESSION: 1. No acute fracture or traumatic subluxation of the cervical spine. 2. Mild T3 superior endplate compression deformity is partially included. 3. Stable multilevel degenerative disc disease and facet hypertrophy. Electronically Signed   By: Narda Rutherford M.D.   On: 05/25/2023 18:34   CT CHEST ABDOMEN PELVIS WO CONTRAST  Result Date: 05/25/2023 CLINICAL DATA:  Polytrauma, blunt.  Loss of consciousness, cough EXAM: CT CHEST, ABDOMEN AND PELVIS WITHOUT CONTRAST TECHNIQUE: Multidetector CT imaging of the chest, abdomen and pelvis was performed following the standard protocol without IV contrast. RADIATION DOSE REDUCTION: This exam was performed according to the departmental dose-optimization program which includes automated exposure control, adjustment of the mA and/or kV according to patient size and/or use of iterative reconstruction technique. COMPARISON:  None Available. FINDINGS: CT CHEST FINDINGS Cardiovascular: Diffuse coronary artery and aortic atherosclerosis. Heart is normal size. Aorta is normal caliber. Mediastinum/Nodes: No mediastinal, hilar, or axillary adenopathy. Trachea and esophagus are unremarkable. 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 Lungs/Pleura: Bilateral lower lobe atelectasis. No confluent opacities or effusions. Musculoskeletal: Chest wall soft tissues are unremarkable. No acute bony abnormality. CT ABDOMEN PELVIS FINDINGS Hepatobiliary: Small gallstone layering within the gallbladder. No focal hepatic abnormality or perihepatic hematoma. Pancreas: No focal abnormality or ductal dilatation. Spleen: No splenic injury or perisplenic hematoma. Adrenals/Urinary Tract: No adrenal hemorrhage or renal injury identified. Bladder is unremarkable. 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 Stomach/Bowel: Sigmoid diverticulosis. No active diverticulitis. Stomach and small bowel decompressed, unremarkable. Vascular/Lymphatic: Diffuse aortoiliac and branch vessel atherosclerosis. No evidence of aneurysm or adenopathy. Reproductive: Uterus and adnexa unremarkable.  No mass. Other: No  free fluid or free air. Musculoskeletal: No acute bony abnormality. IMPRESSION: No acute findings or significant traumatic injury in the  chest, abdomen or pelvis. Bibasilar atelectasis. Right thyroid substernal goiter. In the setting of significant comorbidities or limited life expectancy, no follow-up recommended (ref: J Am Coll Radiol. 2015 Feb;12(2): 143-50). Diffuse coronary artery disease, aortic atherosclerosis. Sigmoid diverticulosis. Electronically Signed   By: Charlett Nose M.D.   On: 05/25/2023 18:33   CT Head Wo Contrast  Result Date: 05/25/2023 CLINICAL DATA:  Head trauma, moderate-severe EXAM: CT HEAD WITHOUT CONTRAST TECHNIQUE: Contiguous axial images were obtained from the base of the skull through the vertex without intravenous contrast. RADIATION DOSE REDUCTION: This exam was performed according to the departmental dose-optimization program which includes automated exposure control, adjustment of the mA and/or kV according to patient size and/or use of iterative reconstruction technique. COMPARISON:  Head CT 3 weeks ago 05/04/2023 FINDINGS: Brain: No intracranial hemorrhage, mass effect, or midline shift. No hydrocephalus. The basilar cisterns are patent. Stable atrophy and chronic small vessel ischemic change. No evidence of territorial infarct or acute ischemia. No extra-axial or intracranial fluid collection. Vascular: Atherosclerosis of skullbase vasculature without hyperdense vessel or abnormal calcification. Skull: No fracture or focal lesion. Sinuses/Orbits: Near complete opacification of right mastoid air cells and partial opacification of left mastoid air cells, unchanged from prior exam no acute findings. Bilateral cataract resection. Other: None. IMPRESSION: 1. No acute intracranial abnormality. No skull fracture. 2. Stable atrophy and chronic small vessel ischemic change. Electronically Signed   By: Narda Rutherford M.D.   On: 05/25/2023 18:29   DG Chest Port 1 View  Result Date: 05/25/2023 CLINICAL DATA:  Questionable sepsis - evaluate for abnormality EXAM: PORTABLE CHEST 1 VIEW COMPARISON:  05/04/2023 FINDINGS:  Heart and mediastinal contours within normal limits. Diffuse aortic atherosclerosis. Platelike atelectasis at the right lung base. Left lung clear. No effusions or acute bony abnormality. IMPRESSION: Right base atelectasis. Aortic atherosclerosis. Electronically Signed   By: Charlett Nose M.D.   On: 05/25/2023 17:33    Pending Labs Unresulted Labs (From admission, onward)     Start     Ordered   05/26/23 0720  Sodium, urine, random  Once,   R        05/26/23 0719   05/26/23 0720  Osmolality, urine  Once,   R        05/26/23 0719            Vitals/Pain Today's Vitals   05/26/23 1030 05/26/23 1122 05/26/23 1200 05/26/23 1212  BP: 134/62  (!) 153/64   Pulse: 63  63   Resp: 16  16   Temp:  98 F (36.7 C)  97.9 F (36.6 C)  TempSrc:  Oral  Oral  SpO2: 99%  100%   Weight:      Height:      PainSc:        Isolation Precautions No active isolations  Medications Medications  sodium chloride flush (NS) 0.9 % injection 3 mL (3 mLs Intravenous Given 05/26/23 0929)  sodium chloride flush (NS) 0.9 % injection 3 mL (has no administration in time range)  0.9 %  sodium chloride infusion (has no administration in time range)  ondansetron (ZOFRAN) tablet 4 mg (has no administration in time range)    Or  ondansetron (ZOFRAN) injection 4 mg (has no administration in time range)  hydrALAZINE (APRESOLINE) injection 10 mg (has no administration in time range)  dextrose (D10W) 10% bolus 250 mL (has  no administration in time range)  dextrose (GLUTOSE) oral gel 40% (has no administration in time range)  enoxaparin (LOVENOX) injection 30 mg (has no administration in time range)  cefTRIAXone (ROCEPHIN) 1 g in sodium chloride 0.9 % 100 mL IVPB (has no administration in time range)  feeding supplement (GLUCERNA SHAKE) (GLUCERNA SHAKE) liquid 237 mL (has no administration in time range)  acetaminophen (TYLENOL) tablet 1,000 mg (1,000 mg Oral Given 05/26/23 1204)  ibuprofen (ADVIL) tablet 400 mg (has  no administration in time range)  lactated ringers bolus 1,000 mL (0 mLs Intravenous Stopped 05/25/23 2003)  fentaNYL (SUBLIMAZE) injection 25 mcg (25 mcg Intravenous Given 05/25/23 1825)  cefTRIAXone (ROCEPHIN) 1 g in sodium chloride 0.9 % 100 mL IVPB (0 g Intravenous Stopped 05/25/23 2136)  iohexol (OMNIPAQUE) 350 MG/ML injection 75 mL (75 mLs Intravenous Contrast Given 05/25/23 2317)  gadobutrol (GADAVIST) 1 MMOL/ML injection 5 mL (5 mLs Intravenous Contrast Given 05/25/23 2351)  acetaminophen (OFIRMEV) IV 1,000 mg (0 mg Intravenous Stopped 05/26/23 0146)  potassium chloride (KLOR-CON) packet 60 mEq (60 mEq Oral Given 05/26/23 0904)    Mobility non-ambulatory     Focused Assessments Pt is on 2L [place].    R Recommendations: See Admitting Provider Note  Report given to:   Additional Notes: pt has on brief.

## 2023-05-26 NOTE — Progress Notes (Signed)
EEG complete - results pending 

## 2023-05-26 NOTE — H&P (Addendum)
. History and Physical    [place] ZOX:[id number] DOB: 12-08-1924 DOA: 05/25/2023  PCP: Blair Heys, MD   Patient coming from: Home   Chief Complaint:  Chief Complaint  Patient presents with   Loss of Consciousness   Cough    HPI: This is a 89 year old female medical history of medical history of hypertension, diabetes mellitus type 2, GERD, hyperlipidemia, cognitive impairment, dementia, restricted mobility due to advanced age use of walker presented to emergency department today after sustained a fall. Patient is very sleepy not opening her eye with flies, and.  She is complaining about back pain and hurting all over as well. Patient's daughter reported that patient recently was in the ED on 05/04/2023 after a fall and imaging showed nondisplaced age intermitted sacral fracture.  Orthopedic evaluated patient recommended PT OT consult and weightbearing as tolerated.  She was discharged home with PT services.  Since discharge patient's functional status has been progressively declined.  She is mostly bedridden and tried to press dissipate with the physical therapist minimally.  Earlier in the morning she was [place] participated with physical therapy with a walker and then went to bathroom.  She had a big bowel movement after 7 days.  She was washing her hand and suddenly felt dizzy and fall backward and landed on her hip.  Family called EMS and patient was brought to the ED for evaluation for fall.  ED Course:  In the ED initial presentation temperature 97, respiratory rate 20, heart rate 66, blood pressure 155/80 and O2 sat 95% room air. Please showed low sodium 129, potassium 3.9, blood glucose 133, chloride 91, creatinine 0.67 and GFR of 70.  Mag 1.7. CBC WBC 7.3, RBC 10.6 and platelet 279. Normal BNP 23. Troponin within normal range.  EKG showed normal sinus rhythm without any ST and T wave abnormality CK 119. UA showed leukocyte esterase and few bacteria.  In the ED patient  got ceftriaxone 1 g once.  Pending urine culture and blood culture. Respiratory panel negative. Initial lactic acid 2.8 which improved to 1.4 with IV hydration. As patient was bedbound for last 3 weeks D-dimer obtained was found slightly elevated 1.24 and CTA ruled out any PE.  CT head ruled out any acute intracranial abnormality and no fracture. CT cervical spine no acute fracture and subluxation. CTA chest rule out PE showed bibasilar atelectasis.  Right thyroid substernal goiter, in the setting of significant comorbidities and limited life expectancy no follow-up recommended. MRI brain no acute intracranial process and no evidence of acute or subacute infarction.  Hospitalist has been consulted admit the patient for management of UTI and discussed the goal of care with patient's family.   Review of Systems:  Review of Systems  Unable to perform ROS: Mental acuity    Past Medical History:  Diagnosis Date   Diabetes mellitus without complication (HCC)    GERD (gastroesophageal reflux disease)    Glaucoma    Hyperlipidemia    Hypertension    Macular degeneration     History reviewed. No pertinent surgical history.   reports that she has never smoked. She does not have any smokeless tobacco history on file. She reports that she does not drink alcohol and does not use drugs.  Allergies  Allergen Reactions   Codeine Other (See Comments)    Other reaction(s): anxiety   Fosamax [Alendronate Sodium]    Lisinopril Other (See Comments)    Other reaction(s): Cough   Other Other (See Comments)  Raloxifene Other (See Comments)   Sulfa Antibiotics Other (See Comments)    Family History  Family history unknown: Yes    Prior to Admission medications   Medication Sig Start Date End Date Taking? Authorizing Provider  atorvastatin (LIPITOR) 80 MG tablet Take 80 mg by mouth daily.   Yes [provider]  Calcium Carbonate-Vitamin D (CALTRATE 600+D PO) Take by mouth.   Yes  [provider]  denosumab (PROLIA) 60 MG/ML SOSY injection Inject 60 mg into the skin every 6 (six) months.   Yes [provider]  docusate sodium (COLACE) 100 MG capsule Take 100 mg by mouth at bedtime.   Yes [provider]  dorzolamide-timolol (COSOPT) 2-0.5 % ophthalmic solution Place 1 drop into both eyes 2 (two) times daily. 04/14/23  Yes [provider]  hydrochlorothiazide (HYDRODIURIL) 25 MG tablet Take 25 mg by mouth daily. 04/14/23  Yes [provider]  ibuprofen (ADVIL) 200 MG tablet Take 200 mg by mouth every 6 (six) hours as needed for mild pain.   Yes [provider]  losartan (COZAAR) 100 MG tablet Take 100 mg by mouth daily.   Yes [provider]  LUMIGAN 0.01 % SOLN Place 1 drop into both eyes at bedtime. 04/05/23  Yes [provider]  metFORMIN (GLUCOPHAGE) 500 MG tablet Take 500 mg by mouth daily with breakfast.   Yes [provider]  Multiple Vitamins-Minerals (PRESERVISION AREDS 2) CAPS Take 1 capsule by mouth 2 (two) times daily.   Yes [provider]  pioglitazone (ACTOS) 45 MG tablet Take 45 mg by mouth daily.   Yes [provider]  oxyCODONE (ROXICODONE) 5 MG immediate release tablet Take 0.5 tablets (2.5 mg total) by mouth every 8 (eight) hours as needed for severe pain. Patient not taking: Reported on 05/25/2023 05/04/23   Sloan Leiter, DO     Physical Exam: Vitals:   05/25/23 2135 05/25/23 2145 05/25/23 2245 05/26/23 0135  BP:  (!) 133/57 (!) 136/42 135/85  Pulse:  (!) 58 (!) 59 67  Resp:  19 17 (!) 25  Temp: (!) 97.4 F (36.3 C)     TempSrc: Oral     SpO2:  100% 100% 100%  Weight:      Height:        Physical Exam Constitutional:      General: She is not in acute distress.    Appearance: She is not ill-appearing.  HENT:     Head: Normocephalic and atraumatic.     Mouth/Throat:     Mouth: Mucous membranes are dry.  Eyes:     Pupils: Pupils are equal,  round, and reactive to light.  Cardiovascular:     Rate and Rhythm: Normal rate and regular rhythm.  Pulmonary:     Effort: Pulmonary effort is normal.     Breath sounds: Normal breath sounds.  Abdominal:     General: Bowel sounds are normal.  Musculoskeletal:     Cervical back: Neck supple.     Right lower leg: No edema.     Left lower leg: No edema.  Skin:    General: Skin is warm.     Capillary Refill: Capillary refill takes less than 2 seconds.  Neurological:     Comments: Alert and oriented to self.      Labs on Admission: I have personally reviewed following labs and imaging studies  CBC: Recent Labs  Lab 05/25/23 1713 05/25/23 1756 05/25/23 1757  WBC 7.3  --   --  NEUTROABS 5.3  --   --   HGB 10.6* 11.6* 11.9*  HCT 31.4* 34.0* 35.0*  MCV 92.4  --   --   PLT 279  --   --    Basic Metabolic Panel: Recent Labs  Lab 05/25/23 1713 05/25/23 1756 05/25/23 1757  NA 129* 128* 128*  K 3.9 3.8 3.8  CL 91* 94*  --   CO2 23  --   --   GLUCOSE 133* 128*  --   BUN 16 15  --   CREATININE 0.67 0.50  --   CALCIUM 7.8*  --   --   MG 1.7  --   --    GFR: Estimated Creatinine Clearance: 33.3 mL/min (by C-G formula based on SCr of 0.5 mg/dL). Liver Function Tests: Recent Labs  Lab 05/25/23 1713  AST 31  ALT 15  ALKPHOS 91  BILITOT 0.7  PROT 5.9*  ALBUMIN 2.7*   No results for input(s): "LIPASE", "AMYLASE" in the last 168 hours. No results for input(s): "AMMONIA" in the last 168 hours. Coagulation Profile: Recent Labs  Lab 05/25/23 1713  INR 1.0   Cardiac Enzymes: Recent Labs  Lab 05/25/23 1713 05/25/23 2000  TROPONINIHS 8 8   BNP (last 3 results) Recent Labs    05/25/23 1713  BNP 29.4   HbA1C: No results for input(s): "HGBA1C" in the last 72 hours. CBG: Recent Labs  Lab 05/25/23 1726  GLUCAP 147*   Lipid Profile: No results for input(s): "CHOL", "HDL", "LDLCALC", "TRIG", "CHOLHDL", "LDLDIRECT" in the last 72 hours. Thyroid Function  Tests: Recent Labs    05/25/23 2000  TSH 1.185   Anemia Panel: No results for input(s): "VITAMINB12", "FOLATE", "FERRITIN", "TIBC", "IRON", "RETICCTPCT" in the last 72 hours. Urine analysis:    Component Value Date/Time   COLORURINE YELLOW 05/25/2023 1741   APPEARANCEUR HAZY (A) 05/25/2023 1741   LABSPEC 1.011 05/25/2023 1741   PHURINE 7.0 05/25/2023 1741   GLUCOSEU NEGATIVE 05/25/2023 1741   HGBUR NEGATIVE 05/25/2023 1741   BILIRUBINUR NEGATIVE 05/25/2023 1741   KETONESUR 5 (A) 05/25/2023 1741   PROTEINUR NEGATIVE 05/25/2023 1741   NITRITE NEGATIVE 05/25/2023 1741   LEUKOCYTESUR LARGE (A) 05/25/2023 1741    Radiological Exams on Admission: I have personally reviewed images MR Brain W and Wo Contrast  Result Date: 05/26/2023 CLINICAL DATA:  Altered mental status EXAM: MRI HEAD WITHOUT AND WITH CONTRAST TECHNIQUE: Multiplanar, multiecho pulse sequences of the brain and surrounding structures were obtained without and with intravenous contrast. CONTRAST:  5mL GADAVIST GADOBUTROL 1 MMOL/ML IV SOLN COMPARISON:  12/25/2017 MRI head, correlation is also made with 05/25/2023 CT head FINDINGS: Evaluation is limited by motion. Brain: No restricted diffusion to suggest acute or subacute infarct. No abnormal parenchymal or meningeal enhancement. No acute hemorrhage, mass, mass effect, or midline shift. No hydrocephalus or extra-axial collection. Normal pituitary and craniocervical junction. No hemosiderin deposition to suggest remote hemorrhage. Normal cerebral volume for age. Dilated perivascular spaces in the basal ganglia. T2 hyperintense signal in the periventricular white matter, likely the sequela of mild chronic small vessel ischemic disease. Vascular: Normal arterial flow voids. Normal arterial and venous enhancement. Skull and upper cervical spine: Normal marrow signal. Sinuses/Orbits: Clear paranasal sinuses. Status post bilateral lens replacements. Other: Fluid in the  right-greater-than-left mastoid air cells. IMPRESSION: No acute intracranial process. No evidence of acute or subacute infarct. Electronically Signed   By: Wiliam Ke M.D.   On: 05/26/2023 00:10   CT Angio Chest PE W  and/or Wo Contrast  Result Date: 05/25/2023 CLINICAL DATA:  Pulmonary embolism (PE) suspected, low to intermediate prob, positive D-dimer. Syncope. EXAM: CT ANGIOGRAPHY CHEST WITH CONTRAST TECHNIQUE: Multidetector CT imaging of the chest was performed using the standard protocol during bolus administration of intravenous contrast. Multiplanar CT image reconstructions and MIPs were obtained to evaluate the vascular anatomy. RADIATION DOSE REDUCTION: This exam was performed according to the departmental dose-optimization program which includes automated exposure control, adjustment of the mA and/or kV according to patient size and/or use of iterative reconstruction technique. CONTRAST:  75mL OMNIPAQUE IOHEXOL 350 MG/ML SOLN COMPARISON:  05/25/2023 FINDINGS: Cardiovascular: No filling defects in the pulmonary arteries to suggest pulmonary emboli. Diffuse coronary artery and aortic atherosclerosis. Heart enlarged. Aorta normal caliber. Mediastinum/Nodes: No mediastinal, hilar, or axillary adenopathy. Trachea and esophagus are unremarkable. Substernal thyroid goiter. Lungs/Pleura: Mucous plugging in the airways in the lower lobes bilaterally, right greater than left. Bibasilar atelectasis. No effusions. Upper Abdomen: No acute findings Musculoskeletal: Chest wall soft tissues are unremarkable. No acute bony abnormality. Review of the MIP images confirms the above findings. IMPRESSION: No evidence of pulmonary embolus. Cardiomegaly, coronary artery disease. Mucous plugging within the lower lobe airways bilaterally with bibasilar atelectasis. Substernal thyroid goiter. In the setting of significant comorbidities or limited life expectancy, no follow-up recommended (ref: J Am Coll Radiol. 2015 Feb;12(2):  143-50). Aortic Atherosclerosis (ICD10-I70.0). Electronically Signed   By: Charlett Nose M.D.   On: 05/25/2023 23:24   CT Cervical Spine Wo Contrast  Result Date: 05/25/2023 CLINICAL DATA:  Neck trauma (Age >= 65y) EXAM: CT CERVICAL SPINE WITHOUT CONTRAST TECHNIQUE: Multidetector CT imaging of the cervical spine was performed without intravenous contrast. Multiplanar CT image reconstructions were also generated. RADIATION DOSE REDUCTION: This exam was performed according to the departmental dose-optimization program which includes automated exposure control, adjustment of the mA and/or kV according to patient size and/or use of iterative reconstruction technique. COMPARISON:  Cervical spine CT 3 weeks ago 05/04/2023 FINDINGS: Alignment: No traumatic subluxation. Skull base and vertebrae: No acute fracture. The skull base and dens are intact. Soft tissues and spinal canal: No prevertebral fluid or swelling. No visible canal hematoma. Heterogeneously enlarged right lobe of the thyroid extending into the mediastinum posteriorly. In the setting of significant comorbidities or limited life expectancy, no follow-up recommended (ref: J Am Coll Radiol. 2015 Feb;12(2): 143-50). Disc levels: Stable multilevel degenerative disc disease and facet hypertrophy. Upper chest: Assessed on concurrent chest CT, reported separately. There is a mild T3 superior endplate compression deformity that is partially included. Other: Carotid calcifications. IMPRESSION: 1. No acute fracture or traumatic subluxation of the cervical spine. 2. Mild T3 superior endplate compression deformity is partially included. 3. Stable multilevel degenerative disc disease and facet hypertrophy. Electronically Signed   By: Narda Rutherford M.D.   On: 05/25/2023 18:34   CT CHEST ABDOMEN PELVIS WO CONTRAST  Result Date: 05/25/2023 CLINICAL DATA:  Polytrauma, blunt.  Loss of consciousness, cough EXAM: CT CHEST, ABDOMEN AND PELVIS WITHOUT CONTRAST TECHNIQUE:  Multidetector CT imaging of the chest, abdomen and pelvis was performed following the standard protocol without IV contrast. RADIATION DOSE REDUCTION: This exam was performed according to the departmental dose-optimization program which includes automated exposure control, adjustment of the mA and/or kV according to patient size and/or use of iterative reconstruction technique. COMPARISON:  None Available. FINDINGS: CT CHEST FINDINGS Cardiovascular: Diffuse coronary artery and aortic atherosclerosis. Heart is normal size. Aorta is normal caliber. Mediastinum/Nodes: No mediastinal, hilar, or axillary adenopathy. Trachea and  esophagus are unremarkable. 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 Lungs/Pleura: Bilateral lower lobe atelectasis. No confluent opacities or effusions. Musculoskeletal: Chest wall soft tissues are unremarkable. No acute bony abnormality. CT ABDOMEN PELVIS FINDINGS Hepatobiliary: Small gallstone layering within the gallbladder. No focal hepatic abnormality or perihepatic hematoma. Pancreas: No focal abnormality or ductal dilatation. Spleen: No splenic injury or perisplenic hematoma. Adrenals/Urinary Tract: No adrenal hemorrhage or renal injury identified. Bladder is unremarkable. 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 Stomach/Bowel: Sigmoid diverticulosis. No active diverticulitis. Stomach and small bowel decompressed, unremarkable. Vascular/Lymphatic: Diffuse aortoiliac and branch vessel atherosclerosis. No evidence of aneurysm or adenopathy. Reproductive: Uterus and adnexa unremarkable.  No mass. Other: No free fluid or free air. Musculoskeletal: No acute bony abnormality. IMPRESSION: No acute findings or significant traumatic injury in the chest, abdomen or pelvis. Bibasilar atelectasis. Right thyroid substernal goiter. In the setting of significant comorbidities or limited life expectancy, no follow-up recommended (ref: J Am Coll Radiol. 2015 Feb;12(2): 143-50).  Diffuse coronary artery disease, aortic atherosclerosis. Sigmoid diverticulosis. Electronically Signed   By: Charlett Nose M.D.   On: 05/25/2023 18:33   CT Head Wo Contrast  Result Date: 05/25/2023 CLINICAL DATA:  Head trauma, moderate-severe EXAM: CT HEAD WITHOUT CONTRAST TECHNIQUE: Contiguous axial images were obtained from the base of the skull through the vertex without intravenous contrast. RADIATION DOSE REDUCTION: This exam was performed according to the departmental dose-optimization program which includes automated exposure control, adjustment of the mA and/or kV according to patient size and/or use of iterative reconstruction technique. COMPARISON:  Head CT 3 weeks ago 05/04/2023 FINDINGS: Brain: No intracranial hemorrhage, mass effect, or midline shift. No hydrocephalus. The basilar cisterns are patent. Stable atrophy and chronic small vessel ischemic change. No evidence of territorial infarct or acute ischemia. No extra-axial or intracranial fluid collection. Vascular: Atherosclerosis of skullbase vasculature without hyperdense vessel or abnormal calcification. Skull: No fracture or focal lesion. Sinuses/Orbits: Near complete opacification of right mastoid air cells and partial opacification of left mastoid air cells, unchanged from prior exam no acute findings. Bilateral cataract resection. Other: None. IMPRESSION: 1. No acute intracranial abnormality. No skull fracture. 2. Stable atrophy and chronic small vessel ischemic change. Electronically Signed   By: Narda Rutherford M.D.   On: 05/25/2023 18:29   DG Chest Port 1 View  Result Date: 05/25/2023 CLINICAL DATA:  Questionable sepsis - evaluate for abnormality EXAM: PORTABLE CHEST 1 VIEW COMPARISON:  05/04/2023 FINDINGS: Heart and mediastinal contours within normal limits. Diffuse aortic atherosclerosis. Platelike atelectasis at the right lung base. Left lung clear. No effusions or acute bony abnormality. IMPRESSION: Right base atelectasis. Aortic  atherosclerosis. Electronically Signed   By: Charlett Nose M.D.   On: 05/25/2023 17:33    EKG: My personal interpretation of EKG shows: Normal sinus rhythm.    Assessment/Plan: Principal Problem:   Vasovagal syncope Active Problems:   Acute metabolic encephalopathy   Diabetes mellitus type 2, controlled (HCC)   Elevated d-dimer   UTI (urinary tract infection)   HLD (hyperlipidemia)   Fall at home, initial encounter   Hyponatremia    Assessment and Plan: No notes have been filed under this hospital service. Service: Hospitalist  Vasovagal syncope Fall due to vasovagal syncope Acute metabolic encephalopathy-stroke has been ruled out Mechanic fall -Based on the patient's history which is obtained from patient daughter.  She had a large bowel movement after 7 days, sitting on the commode and when she stand up washing her hand feels dizzy and  fall backward and landed on her hip.  She also has ongoing cough and feel dizzy with cough as well.  Since last discharge from 05/04/2023 patient's activity has been significantly reduced and mostly she is in the bed.  Today at home patient was working with the PT after that she will has a large bowel movement followed by syncope. Patient also has multiple coughing spells. Workup revealed UTI as well. - Acute metabolic encephalopathy secondary from vasovagal syncope and in the setting of urinary tract infection. -At initial presentation vitals are stable. - CT head ruled out any acute intracranial abnormality and no fracture. CT cervical spine no acute fracture and subluxation. CTA chest rule out PE showed bibasilar atelectasis.  Right thyroid substernal goiter, in the setting of significant comorbidities and limited life expectancy no follow-up recommended. MRI brain no acute intracranial process and no evidence of acute or subacute infarction. -Stroke workup negative. -Admitting patient tonight for observation. -Patient is alert to self only.   Complaining about pain all over.  Continue IV fluid tonight.  Getting a speech evaluation.  Once patient more alert and awake tomorrow will resume diet based on speech evaluation. - Educated patient daughter at the bedside how to prevent further syncope related to vasovagal syncope.  She continues to stand up gradually and need to use support/walker. -Consulted with PT for further evaluation in the a.m. -Consulted to the speech and swallow for swallow evaluation (patient's daughter reported that she has problem swallowing solid food) -Keep patient n.p.o. - Continue gentle hydration - Continue fall and aspiration precaution - Neurocheck every 4 hours - Continue telemonitoring overnight  Acute metabolic encephalopathy/delirium in the setting of UTI UTI Elevated lactic acid-resolved -Patient has history of recurrent UTI in the past - UA showed few bacteria and leukocyte esterase -Elevated lactic acid 2.8 which improved to 1.4 with IV hydration in the ED. received ceftriaxone 1 g. -Patient is afebrile and hemodynamically stable.  No concern for sepsis. -Unable to explain elevated lactic acid however it has been improved with IV bolus so may be patient was hypotensive at home which has been improved now. -Continue gentle hydration NS 100 cc/h -Plan continue ceftriaxone 1 g daily and follow-up with blood and urine culture result. -Will follow-up with blood and urine culture result prior appropriate antibiotic guidance  Elevated D-dimer -Given patient was bedbound for last 3 weeks D-dimer is obtained to rule out PE. -Will do D-dimer 1.4 and age-adjusted D-dimer 0.95 which signifies possible deep pulmonary embolism so CTA obtained and CTA chest ruled out PE.   -Elevated D-dimer likely secondary in the setting of inflammation and trauma from from fall.  Hyponatremia - Serum odium has been dropped 1 34-1 29 over the course of 3 weeks. -Patient's daughter reported that patient has poor oral food  intake. - Continue gentle hydration with NS100 cc/h. -Checking BMP in the morning at 5 AM  History of hypertension -Holding home blood pressure regimen hydrochlorothiazide 25 mg daily and losartan 100 mg daily as patient is n.p.o. -Need to restart blood pressure regimen as appropriate.  Will not tightly controlled patient blood pressure as advanced age.  History of hyperlipidemia -Lipitor on hold  Cognitive memory impairment due to dementia Advanced age with multiple comorbidities -Patient has mild cognitive impairment with dementia as per report patient's daughter. -Consult palliative team to speak with patient's daughter/son tomorrow to discuss the further goals of care.    DVT prophylaxis: SCDs and Lovenox 30 mg daily Code Status: DNR/DNI(Do NOT Intubate).  Patient and daughter Jeannetta Ellis and son Chanise Habeck at the healthcare power of attorney.  Patient's daughter verified and informed me that they would like to continue all medical management except cardiac compression and intubation hence patient will be DNR/DNI. Diet: Currently n.p.o. Family Communication: Patient's daughter at the bedside Disposition Plan: Plan to discharge patient to home with home health/home PT/hospice care based on palliative care team evaluation tomorrow. Consults: Palliative team, PT and transition care team Admission status: Observation, Telemetry bed  Severity of Illness: The appropriate patient status for this patient is OBSERVATION. Observation status is judged to be reasonable and necessary in order to provide the required intensity of service to ensure the patient's safety. The patient's presenting symptoms, physical exam findings, and initial radiographic and laboratory data in the context of their medical condition is felt to place them at decreased risk for further clinical deterioration. Furthermore, it is anticipated that the patient will be medically stable for discharge from the hospital  within 2 midnights of admission.     Tereasa Coop MD [facility]  How to contact the [facility] Attending or Consulting provider 7A - 7P or covering provider during after hours 7P -7A, for this patient?   Check the care team in [facility] and look for a) attending/consulting [facility] provider listed and b) the [facility] team listed Log into [email or web address] and use [place]'s universal password to access. If you do not have the password, please contact the hospital operator. Locate the [facility] provider you are looking for under [facility] and page to a number that you can be directly reached. If you still have difficulty reaching the provider, please page the [facility] (Director on Call) for the Hospitalists listed on amion for assistance.  05/26/2023, 2:21 AM

## 2023-05-26 NOTE — Evaluation (Signed)
Occupational Therapy Evaluation Patient Details Name: Donna Sanford MRN: [id number] DOB: [birth year] Today's Date: 05/26/2023   History of Present Illness 89 y.o. female presents to [facility] hospital on 05/25/2023 after syncopal episode and fall at home. Pt with recent sacral fx ~2 weeks ago and has been having difficulty mobilizing since this time. PMH includes DM, HLD, HTN, GERD, glaucoma.   Clinical Impression   PTA, pt lived alone and recently receiving 24 hour care from daughter since sacral fx. Pt had just began HHPT prior to admission. Upon eval, pt presents with decreased strength, balance, cognition, safety, and problem solving affecting safety during ADL and transfers. Pt and daughter both demonstrating interest in rehabilitation to decr burden of care and optimize pt safety. Pt currently requiring assist for transfers and all ADL. Will continue to follow. Patient will benefit from continued inpatient follow up therapy, <3 hours/day       Recommendations for follow up therapy are one component of a multi-disciplinary discharge planning process, led by the attending physician.  Recommendations may be updated based on patient status, additional functional criteria and insurance authorization.   Assistance Recommended at Discharge Frequent or constant Supervision/Assistance  Patient can return home with the following A little help with walking and/or transfers;A lot of help with bathing/dressing/bathroom;Assistance with cooking/housework;Direct supervision/assist for medications management;Direct supervision/assist for financial management;Assist for transportation;Help with stairs or ramp for entrance;Assistance with feeding    Functional Status Assessment  Patient has had a recent decline in their functional status and demonstrates the ability to make significant improvements in function in a reasonable and predictable amount of time.  Equipment Recommendations  Other (comment) (defer)     Recommendations for Other Services       Precautions / Restrictions Precautions Precautions: Fall Restrictions Weight Bearing Restrictions: No      Mobility Bed Mobility Overal bed mobility: Needs Assistance Bed Mobility: Supine to Sit, Sit to Supine     Supine to sit: Min assist Sit to supine: Mod assist   General bed mobility comments: Min A for cues to get to EOB. Mod A to bring BLE into bed    Transfers Overall transfer level: Needs assistance Equipment used: Rolling walker (2 wheels) Transfers: Sit to/from Stand Sit to Stand: Min assist           General transfer comment: light assist for rise/guidance. Cues for hand placement      Balance Overall balance assessment: Needs assistance Sitting-balance support: Bilateral upper extremity supported, Feet unsupported, No upper extremity supported Sitting balance-Leahy Scale: Poor Sitting balance - Comments: Min guard A for static sitting     Standing balance-Leahy Scale: Poor Standing balance comment: Reliant on RW                           ADL either performed or assessed with clinical judgement   ADL Overall ADL's : Needs assistance/impaired Eating/Feeding: Set up;Cueing for sequencing;Supervision/ safety;Sitting   Grooming: Set up;Bed level;Min guard;Sitting;Wash/dry face Grooming Details (indicate cue type and reason): Min guard at EOB Upper Body Bathing: Set up;Sitting   Lower Body Bathing: Moderate assistance;Sit to/from stand   Upper Body Dressing : Minimal assistance   Lower Body Dressing: Maximal assistance;Sit to/from stand   Toilet Transfer: Stand-pivot;Minimal assistance;Rolling walker (2 wheels);BSC/3in1           Functional mobility during ADLs: Minimal assistance;Rolling walker (2 wheels)       Vision   Vision Assessment?: Vision impaired- to be  further tested in functional context     Perception Perception Perception Tested?: No   Praxis Praxis Praxis tested?:  Not tested    Pertinent Vitals/Pain Pain Assessment Pain Assessment: Faces Faces Pain Scale: Hurts little more Pain Location: sacrum Pain Descriptors / Indicators: Grimacing, Guarding Pain Intervention(s): Limited activity within patient's tolerance, Monitored during session     Hand Dominance     Extremity/Trunk Assessment Upper Extremity Assessment Upper Extremity Assessment: Generalized weakness   Lower Extremity Assessment Lower Extremity Assessment: Defer to PT evaluation       Communication Communication Communication: HOH   Cognition Arousal/Alertness: Awake/alert Behavior During Therapy: Flat affect Overall Cognitive Status: History of cognitive impairments - at baseline                                 General Comments: Follows 1 step commands. Baseline dementia. Slow processing. Suspect close to baseline. Daughter reporting more recent need for cueing for how to get to differing areas of her house via camera system     General Comments  VSS    Exercises     Shoulder Instructions      Home Living Family/patient expects to be discharged to:: Skilled nursing facility Living Arrangements: Alone Available Help at Discharge: Family;Available 24 hours/day Type of Home: House Home Access: Stairs to enter [employer] of Steps: 3 Entrance Stairs-Rails: None Home Layout: One level     Bathroom Shower/Tub: Psychologist, counselling         Home Equipment: Hospital bed;Rollator (4 wheels);BSC/3in1;Transport chair;Shower seat          Prior Functioning/Environment Prior Level of Function : Needs assist;History of Falls (last six months)       Physical Assist : Mobility (physical);ADLs (physical) Mobility (physical): Bed mobility;Transfers;Gait ADLs (physical): Bathing;Dressing;Toileting Mobility Comments: Prior to sacral fx on 6/18 pt was ambulating modified independent with rollator. Since fx pt bedbound x 2 weeks and then amb short  distance with PT with walker with assist ADLs Comments: Since sacral fx assist for bathing, dressing, toileting. Prior to fx assist for bathing and IADL's as well as cues via camera system to remind her to perform ADL        OT Problem List: Decreased strength;Decreased activity tolerance;Impaired balance (sitting and/or standing);Decreased cognition;Decreased safety awareness;Decreased knowledge of use of DME or AE      OT Treatment/Interventions: Self-care/ADL training;Therapeutic exercise;DME and/or AE instruction;Patient/family education;Balance training;Therapeutic activities;Cognitive remediation/compensation    OT Goals(Current goals can be found in the care plan section) Acute Rehab OT Goals Patient Stated Goal: Get better OT Goal Formulation: With patient Time For Goal Achievement: 06/09/23 Potential to Achieve Goals: Good  OT Frequency: Min 2X/week    Co-evaluation              AM-PAC OT "6 Clicks" Daily Activity     Outcome Measure Help from another person eating meals?: A Little Help from another person taking care of personal grooming?: A Little Help from another person toileting, which includes using toliet, bedpan, or urinal?: A Little Help from another person bathing (including washing, rinsing, drying)?: A Lot Help from another person to put on and taking off regular upper body clothing?: A Little Help from another person to put on and taking off regular lower body clothing?: A Lot 6 Click Score: 16   End of Session Equipment Utilized During Treatment: Gait belt;Rolling walker (2 wheels) Nurse Communication: Mobility status  Activity Tolerance:  Patient tolerated treatment well Patient left: in bed;with call bell/phone within reach;with bed alarm set;with family/visitor present  OT Visit Diagnosis: Unsteadiness on feet (R26.81);Muscle weakness (generalized) (M62.81);History of falling (Z91.81);Other symptoms and signs involving cognitive function                 Time: 1654-1720 OT Time Calculation (min): 26 min Charges:  OT General Charges $OT Visit: 1 Visit OT Evaluation $OT Eval Moderate Complexity: 1 Mod OT Treatments $Self Care/Home Management : 8-22 mins  Tyler Deis, OTR/L [facility] Acute Rehabilitation Office: [phone]   Myrla Halsted 05/26/2023, 5:35 PM

## 2023-05-26 NOTE — Evaluation (Signed)
Physical Therapy Evaluation Patient Details Name: Donna Sanford MRN: [id number] DOB: 1924/12/10 Today's Date: 05/26/2023  History of Present Illness  89 y.o. female presents to [facility] hospital on 05/25/2023 after syncopal episode and fall at home. Pt with recent sacral fx ~2 weeks ago and has been having difficulty mobilizing since this time. PMH includes DM, HLD, HTN, GERD, glaucoma.  Clinical Impression  Pt admitted with above diagnosis and presents to PT with functional limitations due to deficits listed below (See PT problem list). Pt needs skilled PT to maximize independence and safety. Prior to fall in mid June pt was living alone and ambulatory with rollator. Expect pt will make steady progress with mobility. Patient will benefit from continued inpatient follow up therapy, <3 hours/day          Assistance Recommended at Discharge Frequent or constant Supervision/Assistance  If plan is discharge home, recommend the following:  Can travel by private vehicle  Two people to help with walking and/or transfers;Two people to help with bathing/dressing/bathroom;Assist for transportation   No    Equipment Recommendations None recommended by PT  Recommendations for Other Services  OT consult    Functional Status Assessment Patient has had a recent decline in their functional status and demonstrates the ability to make significant improvements in function in a reasonable and predictable amount of time.     Precautions / Restrictions Precautions Precautions: Fall Restrictions Weight Bearing Restrictions: No      Mobility  Bed Mobility Overal bed mobility: Needs Assistance Bed Mobility: Supine to Sit, Sit to Supine     Supine to sit: Max assist Sit to supine: Max assist   General bed mobility comments: Assist to bring legs off of bed, elevate trunk into sitting and bring hips to EOB. Assist to lower trunk and bring legs back up into bed    Transfers                    General transfer comment: Pt too fatigued to attempt stand    Ambulation/Gait                  Stairs            Wheelchair Mobility     Tilt Bed    Modified Rankin (Stroke Patients Only)       Balance Overall balance assessment: Needs assistance Sitting-balance support: Bilateral upper extremity supported, Feet unsupported Sitting balance-Leahy Scale: Poor Sitting balance - Comments: UE support and min to min guard for static sitting                                     Pertinent Vitals/Pain Pain Assessment Pain Assessment: Faces Faces Pain Scale: Hurts little more Pain Location: sacrum Pain Descriptors / Indicators: Grimacing, Guarding Pain Intervention(s): Limited activity within patient's tolerance, Monitored during session, Repositioned    Home Living Family/patient expects to be discharged to:: Private residence Living Arrangements: Alone Available Help at Discharge: Family;Available 24 hours/day Type of Home: House Home Access: Stairs to enter Entrance Stairs-Rails: None Entrance Stairs-Number of Steps: 3   Home Layout: One level Home Equipment: Hospital bed;Rollator (4 wheels);BSC/3in1;Transport chair;Shower seat      Prior Function Prior Level of Function : Needs assist;History of Falls (last six months)       Physical Assist : Mobility (physical);ADLs (physical) Mobility (physical): Bed mobility;Transfers;Gait ADLs (physical): Bathing;Dressing;Toileting Mobility Comments: Prior to sacral fx  on 6/18 pt was ambulating modified independent with rollator. Since fx pt bedbound x 2 weeks and then amb short distance with PT with walker with assist ADLs Comments: Since sacral fx assist for bathing, dressing, toileting. Prior to fx assist for bathing and IADL's     Hand Dominance        Extremity/Trunk Assessment   Upper Extremity Assessment Upper Extremity Assessment: Defer to OT evaluation    Lower Extremity  Assessment Lower Extremity Assessment: Generalized weakness       Communication   Communication: HOH  Cognition Arousal/Alertness: Lethargic Behavior During Therapy: Flat affect Overall Cognitive Status: Difficult to assess                                 General Comments: Follows 1 step commands. Baseline dementia.        General Comments      Exercises     Assessment/Plan    PT Assessment Patient needs continued PT services  PT Problem List Decreased strength;Decreased activity tolerance;Decreased balance;Decreased mobility;Pain       PT Treatment Interventions DME instruction;Gait training;Functional mobility training;Therapeutic activities;Therapeutic exercise;Balance training;Patient/family education    PT Goals (Current goals can be found in the Care Plan section)  Acute Rehab PT Goals Patient Stated Goal: Pt unable to state. Daughter would like rehab to become ambulatory again PT Goal Formulation: With family Time For Goal Achievement: 06/09/23 Potential to Achieve Goals: Good    Frequency Min 2X/week     Co-evaluation               AM-PAC PT "6 Clicks" Mobility  Outcome Measure Help needed turning from your back to your side while in a flat bed without using bedrails?: A Lot Help needed moving from lying on your back to sitting on the side of a flat bed without using bedrails?: A Lot Help needed moving to and from a bed to a chair (including a wheelchair)?: Total Help needed standing up from a chair using your arms (e.g., wheelchair or bedside chair)?: Total Help needed to walk in hospital room?: Total Help needed climbing 3-5 steps with a railing? : Total 6 Click Score: 8    End of Session   Activity Tolerance: Patient limited by lethargy Patient left: in bed;with call bell/phone within reach;with family/visitor present Nurse Communication: Mobility status PT Visit Diagnosis: Other abnormalities of gait and mobility  (R26.89);Muscle weakness (generalized) (M62.81);Pain Pain - part of body:  (sacrum)    Time: 1308-6578 PT Time Calculation (min) (ACUTE ONLY): 35 min   Charges:   PT Evaluation $PT Eval Moderate Complexity: 1 Mod PT Treatments $Therapeutic Activity: 8-22 mins PT General Charges $$ ACUTE PT VISIT: 1 Visit         [facility] PT Acute Rehabilitation Services Office [phone]   Angelina Ok [facility] 05/26/2023, 1:51 PM

## 2023-05-26 NOTE — Progress Notes (Signed)
    Patient: Donna Sanford XBM:[id number] DOB: 1925-03-09      Brief hospital course: [name] is a 89 y.o. F with dementia, lives at home, also HTN and DM as well as sacral fracture 2 weeks ago who presented with syncope.    This is a no charge note, for further details, please see the H&P by my partner, Dr. Janalyn Shy from earlier today.   Principal Problem:   Hyponatremia Active Problems:   Acute metabolic encephalopathy   Syncope   UTI (urinary tract infection)   Diabetes mellitus type 2, controlled (HCC)   HLD (hyperlipidemia)   Fall at home, initial encounter   Sacral fracture   Patient admitted with hyponatremia, syncope, weakness, encephalopathy.  Since her sacral fracture, family tried to rehabilitate her at home, but she was unable to get out of bed, and became deconditioned, hyponatremic and when she was finally mobilzied with PT, she syncopized.  Also failed treatment with cephalexin.  Today, she is still encephalopathic and unable to lift her head, stand or participate in self cares.  Will need further work up of hyponatremia, ongoing treatment for UTI       Physical Exam: BP (!) 144/57 (BP Location: Right Arm)   Pulse 63   Temp 97.6 F (36.4 C)   Resp 16   Ht 5\' 2"  (1.575 m)   Wt 55.3 kg   SpO2 99%   BMI 22.31 kg/m   Patient seen and examined.     Family Communication: Daughter        Author: Alberteen Sam, MD 05/26/2023 3:49 PM

## 2023-05-26 NOTE — TOC Progression Note (Signed)
Transition of Care [facility]) - Initial/Assessment Note    Patient Details  Name: Donna Sanford MRN: [id number] Date of Birth: 10-22-1925  Transition of Care [facility]) CM/SW Contact:    Ralene Bathe, LCSW Phone Number: 05/26/2023, 4:53 PM  Clinical Narrative:                 RE: Donna Sanford Date of Birth: [birth year] Date: 05/26/2023  Please be advised that the above-named patient has a primary diagnosis of dementia which supersedes any psychiatric diagnosis. Patient will require a short-term nursing home stay - anticipated 30 days or less for rehabilitation and strengthening.  The plan is for return home.   Expected Discharge Plan: Skilled Nursing Facility Barriers to Discharge: English as a second language teacher, Continued Medical Work up, SNF Pending bed offer   Patient Goals and CMS Choice   CMS Medicare.gov Compare Post Acute Care list provided to:: Patient Represenative (must comment) Choice offered to / list presented to : Adult Children      Expected Discharge Plan and Services In-house Referral: Clinical Social Work     Living arrangements for the past 2 months: Single Family Home                                      Prior Living Arrangements/Services Living arrangements for the past 2 months: Single Family Home Lives with:: Adult Children Patient language and need for interpreter reviewed:: Yes Do you feel safe going back to the place where you live?: Yes      Need for Family Participation in Patient Care: Yes (Comment) Care giver support system in place?: Yes (comment)   Criminal Activity/Legal Involvement Pertinent to Current Situation/Hospitalization: No - Comment as needed  Activities of Daily Living Home Assistive Devices/Equipment: Cane (specify quad or straight), Shower chair with back ADL Screening (condition at time of admission) Patient's cognitive ability adequate to safely complete daily activities?: No Is the patient deaf or have difficulty  hearing?: No Does the patient have difficulty seeing, even when wearing glasses/contacts?: No Does the patient have difficulty concentrating, remembering, or making decisions?: Yes Patient able to express need for assistance with ADLs?: Yes Does the patient have difficulty dressing or bathing?: Yes Independently performs ADLs?: No Communication: Independent Dressing (OT): Needs assistance Is this a change from baseline?: Pre-admission baseline Feeding: Independent Bathing: Dependent Is this a change from baseline?: Pre-admission baseline Toileting: Dependent Is this a change from baseline?: Change from baseline, expected to last >3days In/Out Bed: Dependent Is this a change from baseline?: Change from baseline, expected to last >3 days Walks in Home: Dependent Is this a change from baseline?: Change from baseline, expected to last >3 days Does the patient have difficulty walking or climbing stairs?: Yes Weakness of Legs: Both Weakness of Arms/Hands: Both  Permission Sought/Granted   Permission granted to share information with : Yes, Verbal Permission Granted (granted via patient's daughter Donna Sanford as patient is only oriented to person)     Permission granted to share info w AGENCY: SNFs        Emotional Assessment       Orientation: : Oriented to Self Alcohol / Substance Use: Not Applicable Psych Involvement: No (comment)  Admission diagnosis:  Vasovagal syncope [R55] Encephalopathy [G93.40] Syncope, unspecified syncope type [R55] Patient Active Problem List   Diagnosis Date Noted   Syncope 05/26/2023   UTI (urinary tract infection) 05/26/2023   Acute metabolic encephalopathy 05/26/2023  Diabetes mellitus type 2, controlled (HCC) 05/26/2023   Fall at home, initial encounter 05/26/2023   Hyponatremia 05/26/2023   Closed compression fracture of sacrum (HCC) 05/26/2023   HLD (hyperlipidemia)    Hypertension    PCP:  Blair Heys, MD Pharmacy:   [facility] - [place], [place] - [address] [place] [address] Phone: [phone] Fax: [phone]  [id number] - [place], [place] - [address] [place] [address] Phone: [phone] Fax: [phone]     Social Determinants of Health (SDOH) Social History: SDOH Screenings   Food Insecurity: No Food Insecurity (05/26/2023)  Housing: Low Risk  (05/26/2023)  Transportation Needs: No Transportation Needs (05/26/2023)  Utilities: Not At Risk (05/26/2023)  Tobacco Use: Unknown (05/26/2023)   SDOH Interventions:     Readmission Risk Interventions     No data to display

## 2023-05-26 NOTE — Hospital Course (Signed)
Mrs. Laing is a 89 y.o. F with dementia, lives at home, also HTN and DM as well as sacral fracture 2 weeks ago who presented with syncope.

## 2023-05-26 NOTE — [place] FL2 (Signed)
[place] MEDICAID FL2 LEVEL OF CARE FORM     IDENTIFICATION  Patient Name: [place] Birthdate: 1925-05-27 Sex: female Admission Date (Current Location): 05/25/2023  [facility] and [place] Number:  Producer, television/film/video and Address:  [facility] [place], [address] [place], [place] [address]      Provider Number: [id number]  Attending Physician Name and Address:  Tereasa Coop, MD  Relative Name and Phone Number:  Newell,Ginger  Daughter  [phone]    Current Level of Care: Hospital Recommended Level of Care: Skilled Nursing Facility Prior Approval Number:    Date Approved/Denied:   PASRR Number: Pending  Discharge Plan: SNF    Current Diagnoses: Patient Active Problem List   Diagnosis Date Noted   Syncope 05/26/2023   UTI (urinary tract infection) 05/26/2023   Acute metabolic encephalopathy 05/26/2023   Diabetes mellitus type 2, controlled (HCC) 05/26/2023   Fall at home, initial encounter 05/26/2023   Hyponatremia 05/26/2023   Closed compression fracture of sacrum (HCC) 05/26/2023   HLD (hyperlipidemia)    Hypertension     Orientation RESPIRATION BLADDER Height & Weight     Self  O2 (2L) Incontinent Weight: 122 lb (55.3 kg) Height:  5\' 2"  (157.5 cm)  BEHAVIORAL SYMPTOMS/MOOD NEUROLOGICAL BOWEL NUTRITION STATUS      Incontinent Diet (see d/c summary)  AMBULATORY STATUS COMMUNICATION OF NEEDS Skin   Total Care Verbally Normal                       Personal Care Assistance Level of Assistance  Bathing, Feeding, Dressing Bathing Assistance: Maximum assistance Feeding assistance: Limited assistance Dressing Assistance: Maximum assistance     Functional Limitations Info  Sight, Speech, Hearing Sight Info: Adequate Hearing Info: Adequate Speech Info: Adequate    SPECIAL CARE FACTORS FREQUENCY                       Contractures Contractures Info: Not present    Additional Factors Info  Code Status,  Allergies Code Status Info: DNR Allergies Info: Codeine  Fosamax (Alendronate Sodium)  Lisinopril  Other  Raloxifene  Sulfa Antibiotics           Current Medications (05/26/2023):  This is the current hospital active medication list Current Facility-Administered Medications  Medication Dose Route Frequency Provider Last Rate Last Admin   0.9 %  sodium chloride infusion  250 mL Intravenous PRN Janalyn Shy, Subrina, MD       acetaminophen (TYLENOL) tablet 1,000 mg  1,000 mg Oral TID Alberteen Sam, MD   1,000 mg at 05/26/23 1204   cefTRIAXone (ROCEPHIN) 1 g in sodium chloride 0.9 % 100 mL IVPB  1 g Intravenous Once Alberteen Sam, MD       dextrose (D10W) 10% bolus 250 mL  250 mL Intravenous PRN Janalyn Shy, Subrina, MD       dextrose (GLUTOSE) oral gel 40%  1 Tube Oral Once PRN Janalyn Shy, Subrina, MD       [START ON 05/27/2023] enoxaparin (LOVENOX) injection 30 mg  30 mg Subcutaneous Q24H Sundil, Subrina, MD       feeding supplement (GLUCERNA SHAKE) (GLUCERNA SHAKE) liquid 237 mL  237 mL Oral BID BM Danford, Earl Lites, MD   237 mL at 05/26/23 1602   hydrALAZINE (APRESOLINE) injection 10 mg  10 mg Intravenous Q8H PRN Sundil, Subrina, MD       ibuprofen (ADVIL) tablet 400 mg  400 mg Oral Q6H  PRN Alberteen Sam, MD       ondansetron [facility]) tablet 4 mg  4 mg Oral Q6H PRN Janalyn Shy, Subrina, MD       Or   ondansetron [facility]) injection 4 mg  4 mg Intravenous Q6H PRN Sundil, Subrina, MD       sodium chloride flush (NS) 0.9 % injection 3 mL  3 mL Intravenous Q12H Sundil, Subrina, MD   3 mL at 05/26/23 0929   sodium chloride flush (NS) 0.9 % injection 3 mL  3 mL Intravenous PRN Tereasa Coop, MD         Discharge Medications: Please see discharge summary for a list of discharge medications.  Relevant Imaging Results:  Relevant Lab Results:   Additional Information SSN: [id number]  Tyeisha Dinan F Marwin Primmer, LCSW

## 2023-05-26 NOTE — Procedures (Signed)
Patient Name: Goldy Calandra  MRN: [id number]  Epilepsy Attending: Charlsie Quest  Referring Physician/Provider: Gloris Manchester, MD  Date: 05/26/2023 Duration: 23 mins  Patient history: 89yo F with ams getting eeg to evaluate for seizure.  Level of alertness: Awake  AEDs during EEG study: None  Technical aspects: This EEG study was done with scalp electrodes positioned according to the 10-20 International system of electrode placement. Electrical activity was reviewed with band pass filter of 1-70Hz , sensitivity of 7 uV/mm, display speed of 49mm/sec with a 60Hz  notched filter applied as appropriate. EEG data were recorded continuously and digitally stored.  Video monitoring was available and reviewed as appropriate.  Description: The posterior dominant rhythm consists of 9 Hz activity of moderate voltage (25-35 uV) seen predominantly in posterior head regions, symmetric and reactive to eye opening and eye closing. Hyperventilation and photic stimulation were not performed.     IMPRESSION: This study is within normal limits. No seizures or epileptiform discharges were seen throughout the recording.  A normal interictal EEG does not exclude the diagnosis of epilepsy.  Earnstine Meinders Annabelle Harman

## 2023-05-27 DIAGNOSIS — E871 Hypo-osmolality and hyponatremia: Secondary | ICD-10-CM | POA: Diagnosis not present

## 2023-05-27 DIAGNOSIS — G9340 Encephalopathy, unspecified: Secondary | ICD-10-CM

## 2023-05-27 DIAGNOSIS — Z515 Encounter for palliative care: Secondary | ICD-10-CM

## 2023-05-27 LAB — GLUCOSE, CAPILLARY
Glucose-Capillary: 104 mg/dL — ABNORMAL HIGH (ref 70–99)
Glucose-Capillary: 129 mg/dL — ABNORMAL HIGH (ref 70–99)
Glucose-Capillary: 133 mg/dL — ABNORMAL HIGH (ref 70–99)
Glucose-Capillary: 74 mg/dL (ref 70–99)
Glucose-Capillary: 82 mg/dL (ref 70–99)

## 2023-05-27 LAB — BASIC METABOLIC PANEL
Anion gap: 5 (ref 5–15)
BUN: 9 mg/dL (ref 8–23)
CO2: 25 mmol/L (ref 22–32)
Calcium: 8 mg/dL — ABNORMAL LOW (ref 8.9–10.3)
Chloride: 99 mmol/L (ref 98–111)
Creatinine, Ser: 0.51 mg/dL (ref 0.44–1.00)
GFR, Estimated: >60 mL/min (ref >60)
Glucose, Bld: 93 mg/dL (ref 70–99)
Potassium: 4.4 mmol/L (ref 3.5–5.1)
Sodium: 129 mmol/L — ABNORMAL LOW (ref 135–145)

## 2023-05-27 LAB — URINE CULTURE

## 2023-05-27 MED ORDER — SODIUM CHLORIDE 0.9 % IV SOLN: INTRAVENOUS | Status: AC

## 2023-05-27 MED ORDER — ATORVASTATIN CALCIUM 80 MG PO TABS
80.0000 mg | ORAL_TABLET | Freq: Every day | ORAL | Status: DC
  Administered 2023-05-27 – 2023-05-28 (×2): 80 mg via ORAL
  Filled 2023-05-27 (×2): qty 1

## 2023-05-27 MED ORDER — SODIUM CHLORIDE 0.9 % IV SOLN
INTRAVENOUS | Status: DC
Start: 2023-05-27 — End: 2023-05-27

## 2023-05-27 MED ORDER — CEFDINIR 300 MG PO CAPS
300.0000 mg | ORAL_CAPSULE | Freq: Two times a day (BID) | ORAL | Status: DC
  Administered 2023-05-27 – 2023-05-28 (×2): 300 mg via ORAL
  Filled 2023-05-27 (×3): qty 1

## 2023-05-27 MED ORDER — LOSARTAN POTASSIUM 50 MG PO TABS
100.0000 mg | ORAL_TABLET | Freq: Every day | ORAL | Status: DC
  Administered 2023-05-27 – 2023-05-28 (×2): 100 mg via ORAL
  Filled 2023-05-27 (×3): qty 2

## 2023-05-27 NOTE — Assessment & Plan Note (Signed)
Due to hyponatremia

## 2023-05-27 NOTE — Progress Notes (Signed)
Physical Therapy Treatment Patient Details Name: Donna Sanford MRN: [id number] DOB: 1925/05/27 Today's Date: 05/27/2023   History of Present Illness 89 y.o. female presents to [facility] hospital on 05/25/2023 after syncopal episode and fall at home. Pt with recent sacral fx ~2 weeks ago and has been having difficulty mobilizing since this time. PMH includes DM, HLD, HTN, GERD, glaucoma.    PT Comments  Pt progressing towards physical therapy goals. Pt reports mild lightheadedness throughout session however VSS (see vitals flowsheet for more info). Pt able to take good pivotal steps around to the chair and feel she will be able to progress to gait training next session. Will continue to follow and progress as able per POC.      Assistance Recommended at Discharge Frequent or constant Supervision/Assistance  If plan is discharge home, recommend the following:  Can travel by private vehicle    Two people to help with walking and/or transfers;Two people to help with bathing/dressing/bathroom;Assist for transportation   No  Equipment Recommendations  None recommended by PT    Recommendations for Other Services OT consult     Precautions / Restrictions Precautions Precautions: Fall Restrictions Weight Bearing Restrictions: No     Mobility  Bed Mobility Overal bed mobility: Needs Assistance Bed Mobility: Supine to Sit     Supine to sit: Min assist     General bed mobility comments: Increased time and cues for transition to EOB. Pt able to scoot out and get feet on the floor with multiple small scoots forward.    Transfers Overall transfer level: Needs assistance Equipment used: 2 person hand held assist Transfers: Sit to/from Stand, Bed to chair/wheelchair/BSC Sit to Stand: Min assist, +2 physical assistance   Step pivot transfers: Min assist, +2 physical assistance       General transfer comment: 2 person assist to power up to full stand and steady patient as she took  pivotal steps around to the recliner. Good controlled lower to chair.    Ambulation/Gait               General Gait Details: Unable to progress to ambulation   Stairs             Wheelchair Mobility     Tilt Bed    Modified Rankin (Stroke Patients Only)       Balance Overall balance assessment: Needs assistance Sitting-balance support: Bilateral upper extremity supported, Feet unsupported, No upper extremity supported Sitting balance-Leahy Scale: Poor Sitting balance - Comments: Min guard A for static sitting     Standing balance-Leahy Scale: Poor Standing balance comment: Reliant on RW                            Cognition Arousal/Alertness: Awake/alert Behavior During Therapy: Flat affect Overall Cognitive Status: History of cognitive impairments - at baseline                                 General Comments: Follows 1 step commands. Baseline dementia. Slow processing. Suspect close to baseline. Daughter reporting more recent need for cueing for how to get to differing areas of her house via camera system        Exercises General Exercises - Lower Extremity Long Arc Quad: 10 reps Hip ABduction/ADduction: 10 reps (isometric pillow squeeze)    General Comments        Pertinent Vitals/Pain Pain Assessment Pain  Assessment: Faces Faces Pain Scale: Hurts little more Pain Location: sacrum Pain Descriptors / Indicators: Grimacing, Guarding Pain Intervention(s): Limited activity within patient's tolerance, Monitored during session, Repositioned    Home Living                          Prior Function            PT Goals (current goals can now be found in the care plan section) Acute Rehab PT Goals Patient Stated Goal: Pt unable to state. Daughter would like rehab to become ambulatory again PT Goal Formulation: With family Time For Goal Achievement: 06/09/23 Potential to Achieve Goals: Good Progress towards PT  goals: Progressing toward goals    Frequency    Min 2X/week      PT Plan Current plan remains appropriate    Co-evaluation              AM-PAC PT "6 Clicks" Mobility   Outcome Measure  Help needed turning from your back to your side while in a flat bed without using bedrails?: A Lot Help needed moving from lying on your back to sitting on the side of a flat bed without using bedrails?: A Lot Help needed moving to and from a bed to a chair (including a wheelchair)?: Total Help needed standing up from a chair using your arms (e.g., wheelchair or bedside chair)?: Total Help needed to walk in hospital room?: Total Help needed climbing 3-5 steps with a railing? : Total 6 Click Score: 8    End of Session Equipment Utilized During Treatment: Gait belt Activity Tolerance: Patient tolerated treatment well Patient left: in chair;with call bell/phone within reach;with chair alarm set;with family/visitor present Nurse Communication: Mobility status PT Visit Diagnosis: Other abnormalities of gait and mobility (R26.89);Muscle weakness (generalized) (M62.81);Pain Pain - part of body:  (sacrum)     Time: 1610-9604 PT Time Calculation (min) (ACUTE ONLY): 29 min  Charges:    $Gait Training: 23-37 mins PT General Charges $$ ACUTE PT VISIT: 1 Visit                     Donna Sanford, PT, DPT Acute Rehabilitation Services Secure Chat Preferred Office: [phone]    Donna Sanford 05/27/2023, 3:39 PM

## 2023-05-27 NOTE — Assessment & Plan Note (Signed)
Urine culture with cephalosporin susceptible klebsiella.   - Transition to cefdinir for 5 day course

## 2023-05-27 NOTE — TOC Progression Note (Addendum)
Transition of Care [facility]) - Initial/Assessment Note    Patient Details  Name: Donna Sanford MRN: [id number] Date of Birth: 1925-05-10  [facility]) CM/SW Contact:    Ralene Bathe, LCSW Phone Number: 05/27/2023, 1:46 PM  Clinical Narrative:                 LCSW contacted [facility] (SNF) to inquire as to whether the facility can extend a bed offer. The facility's administrator is reviewing and will inform LCSW of the decision.  Addendum 16:00-  LCSW was contacted by French Ana in admissions at [facility].  The facility can extend a bed offer to the patient.  LCSW contacted the patient's daughter to provide update, submitted clinicals for PASRR, and submitted for insurance authorization.   Reference number- [id number]  Pending insurance authorization.  Addendum 16:28-  PASRR number received.  [id number]  TOC following.    Expected Discharge Plan: Skilled Nursing Facility Barriers to Discharge: English as a second language teacher, Continued Medical Work up, SNF Pending bed offer   Patient Goals and CMS Choice   CMS Medicare.gov Compare Post Acute Care list provided to:: Patient Represenative (must comment) Choice offered to / list presented to : Adult Children      Expected Discharge Plan and Services In-house Referral: Clinical Social Work     Living arrangements for the past 2 months: Single Family Home                                      Prior Living Arrangements/Services Living arrangements for the past 2 months: Single Family Home Lives with:: Adult Children Patient language and need for interpreter reviewed:: Yes Do you feel safe going back to the place where you live?: Yes      Need for Family Participation in Patient Care: Yes (Comment) Care giver support system in place?: Yes (comment)   Criminal Activity/Legal Involvement Pertinent to Current Situation/Hospitalization: No - Comment as needed  Activities of Daily Living Home Assistive  Devices/Equipment: Cane (specify quad or straight), Shower chair with back ADL Screening (condition at time of admission) Patient's cognitive ability adequate to safely complete daily activities?: No Is the patient deaf or have difficulty hearing?: No Does the patient have difficulty seeing, even when wearing glasses/contacts?: No Does the patient have difficulty concentrating, remembering, or making decisions?: Yes Patient able to express need for assistance with ADLs?: Yes Does the patient have difficulty dressing or bathing?: Yes Independently performs ADLs?: No Communication: Independent Dressing (OT): Needs assistance Is this a change from baseline?: Pre-admission baseline Feeding: Independent Bathing: Dependent Is this a change from baseline?: Pre-admission baseline Toileting: Dependent Is this a change from baseline?: Change from baseline, expected to last >3days In/Out Bed: Dependent Is this a change from baseline?: Change from baseline, expected to last >3 days Walks in Home: Dependent Is this a change from baseline?: Change from baseline, expected to last >3 days Does the patient have difficulty walking or climbing stairs?: Yes Weakness of Legs: Both Weakness of Arms/Hands: Both  Permission Sought/Granted   Permission granted to share information with : Yes, Verbal Permission Granted (granted via patient's daughter Ginger as patient is only oriented to person)     Permission granted to share info w AGENCY: SNFs        Emotional Assessment       Orientation: : Oriented to Self Alcohol / Substance Use: Not Applicable Psych Involvement: No (comment)  Admission diagnosis:  Vasovagal syncope [R55] Encephalopathy [G93.40] Syncope, unspecified syncope type [R55] Patient Active Problem List   Diagnosis Date Noted   Syncope 05/26/2023   UTI (urinary tract infection) 05/26/2023   Acute metabolic encephalopathy 05/26/2023   Diabetes mellitus type 2, controlled (HCC)  05/26/2023   Fall at home, initial encounter 05/26/2023   Hyponatremia 05/26/2023   Closed compression fracture of sacrum (HCC) 05/26/2023   HLD (hyperlipidemia)    Hypertension    PCP:  Blair Heys, MD Pharmacy:   [facility] - [place], [place] - [address] [place] [address] Phone: [phone] Fax: [phone]  [id number] - [place],  - [address] [place] [address] Phone: [phone] Fax: [phone]     Social Determinants of Health (SDOH) Social History: SDOH Screenings   Food Insecurity: No Food Insecurity (05/26/2023)  Housing: Low Risk  (05/26/2023)  Transportation Needs: No Transportation Needs (05/26/2023)  Utilities: Not At Risk (05/26/2023)  Tobacco Use: Unknown (05/26/2023)   SDOH Interventions:     Readmission Risk Interventions     No data to display

## 2023-05-27 NOTE — Progress Notes (Signed)
Brief Palliative Medicine Progress Note:  PMT consult received and chart reviewed.   Discussed case with Dr. Maryfrances Bunnell. Patient has shown better mentation and PO intake today - he does not feel PMT is needed at this time. If any future needs arise, he will re-consult. PMT will sign off.   Thank you for allowing PMT to assist in the care of this patient.  Viktor Philipp M. Katrinka Blazing [facility] Palliative Medicine Team Team Phone: [phone] NO CHARGE

## 2023-05-27 NOTE — Assessment & Plan Note (Signed)
Resume atorvastatin. 

## 2023-05-27 NOTE — Progress Notes (Signed)
  Progress Note   Patient: [place] ZOX:[id number] DOB: 22-Sep-1925 DOA: 05/25/2023     1 DOS: the patient was seen and examined on 05/27/2023 at 8:28AM      Brief hospital course: Mrs. Roberge is a 89 y.o. F with dementia, lives at home, also HTN and DM as well as sacral fracture 2 weeks ago who presented with syncope.     Assessment and Plan: * Hyponatremia No change in sodium.  Still exceedingly weak.   Urine sodium low.  PO intake low. - Start IVF - Hold HCTZ   Closed compression fracture of sacrum (HCC) - Analgesics as needed - PT/OT  UTI (urinary tract infection) Urine culture with cephalosporin susceptible klebsiella.   - Transition to cefdinir for 5 day course  Fall at home, initial encounter    Diabetes mellitus type 2, controlled (HCC) Glucose normal - Hold metformin and Actos - Hold SS corrections  Acute metabolic encephalopathy Due to hyponatremia.  Resolving now as expected  Syncope Due to hyponatremia  Hypertension BP near normal - Resume losartan - Hold HCTZ  HLD (hyperlipidemia) - Resume atorvastatin          Subjective: No new complaints.  Still weak.  Only able to stand briefly yesterday. Mentation better today, taking PO intake better.  No fever.     Physical Exam: BP (!) 135/47 (BP Location: Left Arm)   Pulse 64   Temp 97.6 F (36.4 C)   Resp 16   Ht 5\' 2"  (1.575 m)   Wt 47.4 kg   SpO2 97%   BMI 19.11 kg/m   Elderly adult female, lying in bed, appears weak and tired RRR, no murmurs, no peripheral edema Respiratory normal, lungs clear without rales or wheezes Abdomen soft without tenderness to palpation  Data Reviewed: Basic metabolic panel shows sodium slightly up but still in the 120s Potassium normal CBC unremarkable  Family Communication: Daughter at the bedside    Disposition: Status is: Inpatient Patient failed outpatient management of sacral fracture and also UTI.  She is now being given IV  antibiotics and IV fluids, be healthy in the next 24 hours she will be medically stable for discharge to rehab        Author: Alberteen Sam, MD 05/27/2023 2:10 PM  For on call review [email or web address].

## 2023-05-27 NOTE — Assessment & Plan Note (Signed)
BP near normal - Resume losartan - Hold HCTZ

## 2023-05-27 NOTE — Assessment & Plan Note (Signed)
Glucose normal - Hold metformin and Actos - Hold SS corrections

## 2023-05-27 NOTE — Assessment & Plan Note (Addendum)
No change in sodium.  Still exceedingly weak.   Urine sodium low.  PO intake low. - Start IVF - Hold HCTZ

## 2023-05-27 NOTE — Assessment & Plan Note (Addendum)
-   Analgesics as needed - PT/OT

## 2023-05-27 NOTE — Assessment & Plan Note (Addendum)
Due to hyponatremia.  Resolving now as expected

## 2023-05-28 DIAGNOSIS — E871 Hypo-osmolality and hyponatremia: Secondary | ICD-10-CM | POA: Diagnosis not present

## 2023-05-28 LAB — BASIC METABOLIC PANEL
Anion gap: 12 (ref 5–15)
BUN: 5 mg/dL — ABNORMAL LOW (ref 8–23)
CO2: 24 mmol/L (ref 22–32)
Calcium: 8.4 mg/dL — ABNORMAL LOW (ref 8.9–10.3)
Chloride: 102 mmol/L (ref 98–111)
Creatinine, Ser: 0.58 mg/dL (ref 0.44–1.00)
GFR, Estimated: >60 mL/min (ref >60)
Glucose, Bld: 94 mg/dL (ref 70–99)
Potassium: 3.9 mmol/L (ref 3.5–5.1)
Sodium: 138 mmol/L (ref 135–145)

## 2023-05-28 LAB — GLUCOSE, CAPILLARY
Glucose-Capillary: 97 mg/dL (ref 70–99)
Glucose-Capillary: 123 mg/dL — ABNORMAL HIGH (ref 70–99)

## 2023-05-28 LAB — CULTURE, BLOOD (ROUTINE X 2)

## 2023-05-28 MED ORDER — ACETAMINOPHEN 500 MG PO TABS: 1000.0000 mg | ORAL_TABLET | Freq: Three times a day (TID) | ORAL | Status: AC

## 2023-05-28 MED ORDER — CEFDINIR 300 MG PO CAPS: 300.0000 mg | ORAL_CAPSULE | Freq: Two times a day (BID) | ORAL | Status: AC

## 2023-05-28 MED ORDER — DOCUSATE SODIUM 100 MG PO CAPS: 100.0000 mg | ORAL_CAPSULE | Freq: Every day | ORAL | Status: DC

## 2023-05-28 MED ORDER — GLUCERNA SHAKE PO LIQD: 237.0000 mL | Freq: Two times a day (BID) | ORAL | Status: AC

## 2023-05-28 NOTE — Progress Notes (Addendum)
Pt to be d/c to [facility] [place].  Report was called to [facility], RN on 05/28/2023 at 1315.  Reviewed AVS, POC and H/P with Cassandra, RN.  Asked her if she had any pending questions.  Cassandra, RN had no further questions.   Informed pt and her daughter that she was being d/c to [facility] [place].  Reviewed AVS with pt and daughter.  Assisted pt in getting dressed and gathering her belongings.  Answered any pending questions.  Pt did not have a PIV upon assessment.  Daughter nor pt had any pending questions.  PTAR for transport to [facility] via gurney.  Cassandra, RN was called to be notified that pt in route.  Pt being d/c in stable condition.

## 2023-05-28 NOTE — TOC Transition Note (Signed)
Transition of Care [facility]) - CM/SW Discharge Note   Patient Details  Name: Donna Sanford MRN: [id number] Date of Birth: May 01, 1925  [facility]) CM/SW Contact:  Ralene Bathe, LCSW Phone Number: 05/28/2023, 12:54 PM   Clinical Narrative:    Patient will DC to: [facility] Anticipated DC date: 05/28/2023 Family notified: Yes Transport by: Sharin Mons   Per MD patient ready for DC to SNF. RN to call report prior to discharge [phone] room 304A). RN, patient's family, and facility notified of DC. Discharge Summary sent to facility.  Ambulance transport will be requested for patient.   CSW will sign off for now as social work intervention is no longer needed. Please consult [place] again if new needs arise.    Final next level of care: Skilled Nursing Facility Barriers to Discharge: Barriers Resolved   Patient Goals and CMS Choice CMS Medicare.gov Compare Post Acute Care list provided to:: Patient Represenative (must comment) Choice offered to / list presented to : Adult Children  Discharge Placement                Patient chooses bed at: [facility], [place] Patient to be transferred to facility by: PTAR Name of family member notified: Newell,Ginger (Daughter)  [phone] Patient and family notified of of transfer: 05/28/23  Discharge Plan and Services Additional resources added to the After Visit Summary for   In-house Referral: Clinical Social Work                                   Social Determinants of Health (SDOH) Interventions SDOH Screenings   Food Insecurity: No Food Insecurity (05/26/2023)  Housing: Low Risk  (05/26/2023)  Transportation Needs: No Transportation Needs (05/26/2023)  Utilities: Not At Risk (05/26/2023)  Tobacco Use: Unknown (05/26/2023)     Readmission Risk Interventions     No data to display

## 2023-05-28 NOTE — Plan of Care (Signed)
Problem: Education: Goal: Knowledge of General Education information will improve Description: Including pain rating scale, medication(s)/side effects and non-pharmacologic comfort measures Outcome: Progressing Pt has an understanding that she was admitted into the hospital s/p fall d/t syncopal episode.  Even though she has AMS she is easily reoriented.  Pt is currently awaiting d/c to SNF.   Problem: Clinical Measurements: Goal: Ability to maintain clinical measurements within normal limits will improve Outcome: Progressing Pt has a history of HTN.  She was slightly hypertensive this AM.  All other VS and labs WNL.  She is currently awaiting d/c to SNF.   Problem: Clinical Measurements: Goal: Will remain free from infection Outcome: Progressing S/Sx of infection monitored and assessed q-shift.  Pt has remained afebrile thus far.  She is on PO abx per MD's orders.   Problem: Clinical Measurements: Goal: Respiratory complications will improve Outcome: Progressing Respiratory status monitored and assessed q-shift.  Pt is on room air with O2 at 99% and respiration rate of 17 breaths per minute.  Pt has not endorsed c/o SOB or DOE.   Problem: Clinical Measurements: Goal: Cardiovascular complication will be avoided Outcome: Progressing Pt's VSS assessed q8 hours.  Her HR has been WNL.   Problem: Activity: Goal: Risk for activity intolerance will decrease Outcome: Progressing Pt is totally dependent of all her ADLs.  She needs the assistance of RN staff to turn and reposition herself.   Problem: Nutrition: Goal: Adequate nutrition will be maintained Outcome: Progressing Pt is on a carb modified diet per MD's orders.  She was able to eat 90% of her meals thus far and drank 100% of her Glucerna.   Problem: Elimination: Goal: Will not experience complications related to bowel motility Outcome: Progressing Pt's daughter states that pt has been having constipation issues.  MD ordered  bowel regiment for pt.   Problem: Elimination: Goal: Will not experience complications related to urinary retention Outcome: Progressing Pt is incontinent of her urine.  He has not endorsed c/o abdominal pain/ distention, dysuria or urinary retention.   Problem: Pain Managment: Goal: General experience of comfort will improve Outcome: Progressing Pt has endorsed c/o 6-8/10 sacral pain r/t her sacral fx describing it as a constant ache. Reiterated pain scale so she could adequately rate her pain.  Pt stated her pain goal would be 0/10 this admission.  Discussed nonpharmacological methods to help reduce s/sx of pain.  Interventions given per pt's request and MD's orders.        Problem: Safety: Goal: Ability to remain free from injury will improve Outcome: Progressing Pt has remained free from falls thus far.  Instructed pt and family to utilize RN call light for assistance.  Hourly rounds performed.  Bed alarm implemented to keep pt safe from falls.  Settings activated to third most sensitive mode.  Bed in lowest position, locked with two upper/ one lower side rails engaged.  Belongings and call light within reach.    Problem: Skin Integrity: Goal: Risk for impaired skin integrity will decrease Outcome: Progressing Skin integrity monitored and assessed q2 hours.  Pt is on q2 hourly turns/ repositioning to prevent further skin impairment.  Tubes and drains assessed for device related pressures sores.  Pt is incontinent of both bowel and bladder.  She is checked q2 hours for bowel incontinence.  Perineal care given promptly after each episode.  Pt has an external female catheter (purewick) in place to capture her urine.  Maintenance and care of  external female catheter (purewick) performed  per [facility] policy, procedure and guidelines.  Dressing changes performed per MD's orders.

## 2023-05-28 NOTE — Progress Notes (Signed)
Physical Therapy Treatment Patient Details Name: Donna Sanford MRN: [id number] DOB: [birth year] Today's Date: 05/28/2023   History of Present Illness 89 y.o. female [facility] to [facility] hospital on 05/25/[facility]23 after syncopal episode and fall at home. Pt with recent sacral fx ~2 weeks ago and has been having difficulty mobilizing since this time. PMH includes DM, HLD, HTN, GERD, glaucoma.    PT Comments  Progressed patient towards goals per plan of care. Patient tolerated today's session well with a focus to progress gait training. Performed bed mobility and transfers with min A provided from PT. Patient required heavy cues for scooting, UE and LE placement for transfers; with good return demonstration. Pt demonstrated ability to walk into hallway; required one seated rest break before returning to back to bed. She continues to demonstrate significant improvements throughout acute stay. Based on today's pt performance and high fall risk, PT recommends skilled rehab < 3 hours in order to improve LE strength, activity tolerance and promote independence for safer discharge to home.      Assistance Recommended at Discharge Frequent or constant Supervision/Assistance  If plan is discharge home, recommend the following:  Can travel by private vehicle    Two people to help with walking and/or transfers;Two people to help with bathing/dressing/bathroom;Assist for transportation   No  Equipment Recommendations  None recommended by PT    Recommendations for Other Services       Precautions / Restrictions Precautions Precautions: Fall Restrictions Weight Bearing Restrictions: No     Mobility  Bed Mobility Overal bed mobility: Needs Assistance Bed Mobility: Rolling, Sidelying to Sit Rolling: Min guard Sidelying to sit: Min assist   Sit to supine: Min assist   General bed mobility comments: Patient able to perform log roll technique with minimal support. Required minA for trunk support  transitioning to EOB and LE management in return to supine. Verbal cues provided to transition LE towards EOB.    Transfers Overall transfer level: Needs assistance Equipment used: Rolling walker (2 wheels) Transfers: Sit to/from Stand Sit to Stand: Min assist           General transfer comment: Provided minA for powerup, patient able to maintain upright stance within RW and verbal cue for trunk extension.    Ambulation/Gait Ambulation/Gait assistance: Min assist Gait Distance (Feet): 15 Feet Assistive device: Rolling walker (2 wheels) Gait Pattern/deviations: Decreased step length - right, Decreased step length - left, Decreased stride length, Step-to pattern, Trunk flexed Gait velocity: Decreased Gait velocity interpretation: <1.31 ft/sec, indicative of household ambulator Pre-gait activities: Standing Marching in RW x 10 steps General Gait Details: 15 ft (x2). Presented with reduced velocity and step to pattern. Patient required seated rest break due to fatigue. MinA and heavy cue for RW management.   Stairs             Wheelchair Mobility     Tilt Bed    Modified Rankin (Stroke Patients Only)       Balance Overall balance assessment: Needs assistance Sitting-balance support: No upper extremity supported, Feet supported Sitting balance-Leahy Scale: Fair Sitting balance - Comments: Able to sit EOB statically and reach upward and forwards   Standing balance support: Bilateral upper extremity supported, During functional activity, Reliant on assistive device for balance Standing balance-Leahy Scale: Poor Standing balance comment: Reliant on RW throughout gait training                            Cognition Arousal/Alertness:  Awake/alert Behavior During Therapy: Flat affect Overall Cognitive Status: History of cognitive impairments - at baseline                                 General Comments: Followed One step commands with  increased time, slow processing, requires multimodal cues to accomplish tasks.        Exercises General Exercises - Lower Extremity Heel Slides: AROM, 5 reps, Both, Supine    General Comments General comments (skin integrity, edema, etc.): Daughter present throughout session, very supportive and motivational      Pertinent Vitals/Pain Pain Assessment Pain Assessment: Faces Faces Pain Scale: Hurts a little bit Pain Location: sacrum Pain Descriptors / Indicators: Grimacing, Guarding Pain Intervention(s): Monitored during session    Home Living                          Prior Function            PT Goals (current goals can now be found in the care plan section) Acute Rehab PT Goals Patient Stated Goal: Pt unable to state. Daughter would like rehab to become ambulatory again PT Goal Formulation: With family Time For Goal Achievement: 06/09/23 Potential to Achieve Goals: Good Progress towards PT goals: Progressing toward goals    Frequency    Min 2X/week      PT Plan Current plan remains appropriate    Co-evaluation              AM-PAC PT "6 Clicks" Mobility   Outcome Measure  Help needed turning from your back to your side while in a flat bed without using bedrails?: A Lot Help needed moving from lying on your back to sitting on the side of a flat bed without using bedrails?: A Lot Help needed moving to and from a bed to a chair (including a wheelchair)?: A Lot Help needed standing up from a chair using your arms (e.g., wheelchair or bedside chair)?: A Lot Help needed to walk in hospital room?: A Lot Help needed climbing 3-5 steps with a railing? : Total 6 Click Score: 11    End of Session Equipment Utilized During Treatment: Gait belt Activity Tolerance: Patient tolerated treatment well Patient left: in bed;with call bell/phone within reach;with bed alarm set;with family/visitor present Nurse Communication: Mobility status PT Visit Diagnosis:  Other abnormalities of gait and mobility (R26.89);Muscle weakness (generalized) (M62.81);Pain Pain - part of body:  (Sacrum)     Time: 0981-1914 PT Time Calculation (min) (ACUTE ONLY): 27 min  Charges:    $Gait Training: 23-37 mins PT General Charges $$ ACUTE PT VISIT: 1 Visit                     Christene Lye, SPT Acute Rehabilitation Services [phone] Secure chat preferred     Christene Lye 05/28/2023, 3:29 PM

## 2023-05-28 NOTE — Discharge Summary (Signed)
Physician Discharge Summary   Patient: Donna Sanford MRN: [id number] DOB: Feb 14, 1925  Admit date:     05/25/2023  Discharge date: 05/28/23  Discharge Physician: Alberteen Sam   PCP: Blair Heys, MD     Recommendations at discharge:  Follow up with Dr. Manus Gunning in 1 week after discharge from rehab Dr. Manus Gunning: Please check BMP at follow up Please resume hydrochlorothiazide as appropriate     Discharge Diagnoses: Principal Problem:   Hyponatremia Active Problems:   UTI (urinary tract infection)   Closed compression fracture of sacrum (HCC)   HLD (hyperlipidemia)   Hypertension   Syncope   Acute metabolic encephalopathy   Diabetes mellitus type 2, controlled (HCC)   Fall at home, initial encounter      Hospital Course: Donna Sanford is a 89 y.o. F with dementia, lives at home, also HTN and DM as well as sacral fracture 2 weeks ago who presented with syncope.  Patient had fallen and developed sacral compression fracture 2-3 weeks PTA.  For some reason, there was a delay of 2 weeks in arranging [facility] services, and during that time, she was bedbound only standing maybe 2-3 times, and with poor oral intake and increasing delirium.  On first day of working with PT, she ambulated with therapist across the room, and was standing at the sink when she became limp and collapsed, not responding for several minutes.  EMS activated.  In the ER, found to have hyponatremia.      * Hyponatremia Admitted, hydrochlorothiazide held. Urine sodium <20.  Started on IV fluids and oral fluid intake liberalized and Na normalized.    Closed compression fracture of sacrum (HCC) Doing well with PT and just acetaminophen and ibuprofen.     UTI (urinary tract infection) Reported dysuria and suprapubic pain.  Urine culture with cephalosporin susceptible Klebsiella.  Treated with Rocephin and given cefdinir at discharge to complete 5 days.     Fall at home, initial  encounter Syncope Acute metabolic encephalopathy On admission, patient was somnolent, poorly responsive.  This lasted >24 hours and only improved with fluids, antibiotics.  Due to hypovolemia, age, hyponatremia.   Diabetes mellitus type 2, controlled (HCC)           The [facility] Controlled Substances Registry was reviewed for this patient prior to discharge.  Consultants: None   Disposition: Skilled nursing facility Diet recommendation:  Discharge Diet Orders (From admission, onward)     Start     Ordered   05/28/23 0000  Diet - low sodium heart healthy        05/28/23 1046             DISCHARGE MEDICATION: Allergies as of 05/28/2023       Reactions   Codeine Other (See Comments)   Other reaction(s): anxiety   Fosamax [alendronate Sodium]    Lisinopril Other (See Comments)   Other reaction(s): Cough   Other Other (See Comments)   Raloxifene Other (See Comments)   Sulfa Antibiotics Other (See Comments)        Medication List     STOP taking these medications    hydrochlorothiazide 25 MG tablet Commonly known as: HYDRODIURIL   oxyCODONE 5 MG immediate release tablet Commonly known as: Roxicodone       TAKE these medications    acetaminophen 500 MG tablet Commonly known as: TYLENOL Take 2 tablets (1,000 mg total) by mouth 3 (three) times daily.   atorvastatin 80 MG tablet Commonly known as: LIPITOR  Take 80 mg by mouth daily.   CALTRATE 600+D PO Take by mouth.   cefdinir 300 MG capsule Commonly known as: OMNICEF Take 1 capsule (300 mg total) by mouth every 12 (twelve) hours for 4 doses.   denosumab 60 MG/ML Sosy injection Commonly known as: PROLIA Inject 60 mg into the skin every 6 (six) months.   docusate sodium 100 MG capsule Commonly known as: COLACE Take 100 mg by mouth at bedtime.   dorzolamide-timolol 2-0.5 % ophthalmic solution Commonly known as: COSOPT Place 1 drop into both eyes 2 (two) times daily.   feeding  supplement (GLUCERNA SHAKE) Liqd Take 237 mLs by mouth 2 (two) times daily between meals.   ibuprofen 200 MG tablet Commonly known as: ADVIL Take 200 mg by mouth every 6 (six) hours as needed for mild pain.   losartan 100 MG tablet Commonly known as: COZAAR Take 100 mg by mouth daily.   Lumigan 0.01 % Soln Generic drug: bimatoprost Place 1 drop into both eyes at bedtime.   metFORMIN 500 MG tablet Commonly known as: GLUCOPHAGE Take 500 mg by mouth daily with breakfast.   pioglitazone 45 MG tablet Commonly known as: ACTOS Take 45 mg by mouth daily.   PreserVision AREDS 2 Caps Take 1 capsule by mouth 2 (two) times daily.        Follow-up Information     Blair Heys, MD Follow up.   Specialty: Family Medicine Contact information: [address] [place] [address] [phone]                 Discharge Instructions     Diet - low sodium heart healthy   Complete by: As directed    Discharge instructions   Complete by: As directed    **IMPORTANT DISCHARGE INSTRUCTIONS**   From Dr. Maryfrances Bunnell: You were admitted for passing out from low sodium  This was due to dehydration and poor oral intake  You were given IV fluids and this resolved  Work with physical therapy daily  Use acetaminophen and ibuprofen for pain control of the sacral fracture, as you have been  Go see Dr. Manus Gunning in 1 week after discharge from rehab  Continue your diabetes medicines and losartan  Resume hydrochlorothiazide only if Dr. Manus Gunning suggests it  [name] taking 2 more days of antibiotics for the UTI  Take a Glucerna twice daily   Increase activity slowly   Complete by: As directed        Discharge Exam: Filed Weights   05/25/23 1708 05/27/23 0405  Weight: 55.3 kg 47.4 kg    General: Pt is alert, awake, not in acute distress, sitting up in bed, eating breakfast Cardiovascular: RRR, nl S1-S2, no murmurs appreciated.   No LE edema.   Respiratory:  Normal respiratory rate and rhythm.  CTAB without rales or wheezes. Abdominal: Abdomen soft and non-tender.  No distension or HSM.   Neuro/Psych: Strength symmetric in upper and lower extremities.  Judgment and insight appear at baseline, mild memory impairemnt.   Condition at discharge: stable  The results of significant diagnostics from this hospitalization (including imaging, microbiology, ancillary and laboratory) are listed below for reference.   Imaging Studies: EEG adult  Result Date: 06/09/2023 Charlsie Quest, MD     2023-06-09 10:47 AM Patient Name: Martin Umpierre MRN: [id number] Epilepsy Attending: Charlsie Quest Referring Physician/Provider: Gloris Manchester, MD Date: Jun 09, 2023 Duration: 23 mins Patient history: 89yo F with ams getting eeg to evaluate for seizure. Level  of alertness: Awake AEDs during EEG study: None Technical aspects: This EEG study was done with scalp electrodes positioned according to the 10-20 International system of electrode placement. Electrical activity was reviewed with band pass filter of 1-70Hz , sensitivity of 7 uV/mm, display speed of 64mm/sec with a 60Hz  notched filter applied as appropriate. EEG data were recorded continuously and digitally stored.  Video monitoring was available and reviewed as appropriate. Description: The posterior dominant rhythm consists of 9 Hz activity of moderate voltage (25-35 uV) seen predominantly in posterior head regions, symmetric and reactive to eye opening and eye closing. Hyperventilation and photic stimulation were not performed.   IMPRESSION: This study is within normal limits. No seizures or epileptiform discharges were seen throughout the recording. A normal interictal EEG does not exclude the diagnosis of epilepsy. Charlsie Quest   MR Brain W and Wo Contrast  Result Date: 05/26/2023 CLINICAL DATA:  Altered mental status EXAM: MRI HEAD WITHOUT AND WITH CONTRAST TECHNIQUE: Multiplanar, multiecho pulse sequences of the  brain and surrounding structures were obtained without and with intravenous contrast. CONTRAST:  5mL GADAVIST GADOBUTROL 1 MMOL/ML IV SOLN COMPARISON:  12/25/2017 MRI head, correlation is also made with 05/25/2023 CT head FINDINGS: Evaluation is limited by motion. Brain: No restricted diffusion to suggest acute or subacute infarct. No abnormal parenchymal or meningeal enhancement. No acute hemorrhage, mass, mass effect, or midline shift. No hydrocephalus or extra-axial collection. Normal pituitary and craniocervical junction. No hemosiderin deposition to suggest remote hemorrhage. Normal cerebral volume for age. Dilated perivascular spaces in the basal ganglia. T2 hyperintense signal in the periventricular white matter, likely the sequela of mild chronic small vessel ischemic disease. Vascular: Normal arterial flow voids. Normal arterial and venous enhancement. Skull and upper cervical spine: Normal marrow signal. Sinuses/Orbits: Clear paranasal sinuses. Status post bilateral lens replacements. Other: Fluid in the right-greater-than-left mastoid air cells. IMPRESSION: No acute intracranial process. No evidence of acute or subacute infarct. Electronically Signed   By: Wiliam Ke M.D.   On: 05/26/2023 00:10   CT Angio Chest PE W and/or Wo Contrast  Result Date: 05/25/2023 CLINICAL DATA:  Pulmonary embolism (PE) suspected, low to intermediate prob, positive D-dimer. Syncope. EXAM: CT ANGIOGRAPHY CHEST WITH CONTRAST TECHNIQUE: Multidetector CT imaging of the chest was performed using the standard protocol during bolus administration of intravenous contrast. Multiplanar CT image reconstructions and MIPs were obtained to evaluate the vascular anatomy. RADIATION DOSE REDUCTION: This exam was performed according to the departmental dose-optimization program which includes automated exposure control, adjustment of the mA and/or kV according to patient size and/or use of iterative reconstruction technique. CONTRAST:   75mL OMNIPAQUE IOHEXOL 350 MG/ML SOLN COMPARISON:  05/25/2023 FINDINGS: Cardiovascular: No filling defects in the pulmonary arteries to suggest pulmonary emboli. Diffuse coronary artery and aortic atherosclerosis. Heart enlarged. Aorta normal caliber. Mediastinum/Nodes: No mediastinal, hilar, or axillary adenopathy. Trachea and esophagus are unremarkable. Substernal thyroid goiter. Lungs/Pleura: Mucous plugging in the airways in the lower lobes bilaterally, right greater than left. Bibasilar atelectasis. No effusions. Upper Abdomen: No acute findings Musculoskeletal: Chest wall soft tissues are unremarkable. No acute bony abnormality. Review of the MIP images confirms the above findings. IMPRESSION: No evidence of pulmonary embolus. Cardiomegaly, coronary artery disease. Mucous plugging within the lower lobe airways bilaterally with bibasilar atelectasis. Substernal thyroid goiter. In the setting of significant comorbidities or limited life expectancy, no follow-up recommended (ref: J Am Coll Radiol. 2015 Feb;12(2): 143-50). Aortic Atherosclerosis (ICD10-I70.0). Electronically Signed   By: Charlett Nose M.D.  On: 05/25/2023 23:24   CT Cervical Spine Wo Contrast  Result Date: 05/25/2023 CLINICAL DATA:  Neck trauma (Age >= 65y) EXAM: CT CERVICAL SPINE WITHOUT CONTRAST TECHNIQUE: Multidetector CT imaging of the cervical spine was performed without intravenous contrast. Multiplanar CT image reconstructions were also generated. RADIATION DOSE REDUCTION: This exam was performed according to the departmental dose-optimization program which includes automated exposure control, adjustment of the mA and/or kV according to patient size and/or use of iterative reconstruction technique. COMPARISON:  Cervical spine CT 3 weeks ago 05/04/2023 FINDINGS: Alignment: No traumatic subluxation. Skull base and vertebrae: No acute fracture. The skull base and dens are intact. Soft tissues and spinal canal: No prevertebral fluid or  swelling. No visible canal hematoma. Heterogeneously enlarged right lobe of the thyroid extending into the mediastinum posteriorly. In the setting of significant comorbidities or limited life expectancy, no follow-up recommended (ref: J Am Coll Radiol. 2015 Feb;12(2): 143-50). Disc levels: Stable multilevel degenerative disc disease and facet hypertrophy. Upper chest: Assessed on concurrent chest CT, reported separately. There is a mild T3 superior endplate compression deformity that is partially included. Other: Carotid calcifications. IMPRESSION: 1. No acute fracture or traumatic subluxation of the cervical spine. 2. Mild T3 superior endplate compression deformity is partially included. 3. Stable multilevel degenerative disc disease and facet hypertrophy. Electronically Signed   By: Narda Rutherford M.D.   On: 05/25/2023 18:34   CT CHEST ABDOMEN PELVIS WO CONTRAST  Result Date: 05/25/2023 CLINICAL DATA:  Polytrauma, blunt.  Loss of consciousness, cough EXAM: CT CHEST, ABDOMEN AND PELVIS WITHOUT CONTRAST TECHNIQUE: Multidetector CT imaging of the chest, abdomen and pelvis was performed following the standard protocol without IV contrast. RADIATION DOSE REDUCTION: This exam was performed according to the departmental dose-optimization program which includes automated exposure control, adjustment of the mA and/or kV according to patient size and/or use of iterative reconstruction technique. COMPARISON:  None Available. FINDINGS: CT CHEST FINDINGS Cardiovascular: Diffuse coronary artery and aortic atherosclerosis. Heart is normal size. Aorta is normal caliber. Mediastinum/Nodes: No mediastinal, hilar, or axillary adenopathy. Trachea and esophagus are unremarkable. 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 Lungs/Pleura: Bilateral lower lobe atelectasis. No confluent opacities or effusions. Musculoskeletal: Chest wall soft tissues are unremarkable. No acute bony abnormality. CT ABDOMEN PELVIS FINDINGS  Hepatobiliary: Small gallstone layering within the gallbladder. No focal hepatic abnormality or perihepatic hematoma. Pancreas: No focal abnormality or ductal dilatation. Spleen: No splenic injury or perisplenic hematoma. Adrenals/Urinary Tract: No adrenal hemorrhage or renal injury identified. Bladder is unremarkable. 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 Stomach/Bowel: Sigmoid diverticulosis. No active diverticulitis. Stomach and small bowel decompressed, unremarkable. Vascular/Lymphatic: Diffuse aortoiliac and branch vessel atherosclerosis. No evidence of aneurysm or adenopathy. Reproductive: Uterus and adnexa unremarkable.  No mass. Other: No free fluid or free air. Musculoskeletal: No acute bony abnormality. IMPRESSION: No acute findings or significant traumatic injury in the chest, abdomen or pelvis. Bibasilar atelectasis. Right thyroid substernal goiter. In the setting of significant comorbidities or limited life expectancy, no follow-up recommended (ref: J Am Coll Radiol. 2015 Feb;12(2): 143-50). Diffuse coronary artery disease, aortic atherosclerosis. Sigmoid diverticulosis. Electronically Signed   By: Charlett Nose M.D.   On: 05/25/2023 18:33   CT Head Wo Contrast  Result Date: 05/25/2023 CLINICAL DATA:  Head trauma, moderate-severe EXAM: CT HEAD WITHOUT CONTRAST TECHNIQUE: Contiguous axial images were obtained from the base of the skull through the vertex without intravenous contrast. RADIATION DOSE REDUCTION: This exam was performed according to the departmental dose-optimization program which  includes automated exposure control, adjustment of the mA and/or kV according to patient size and/or use of iterative reconstruction technique. COMPARISON:  Head CT 3 weeks ago 05/04/2023 FINDINGS: Brain: No intracranial hemorrhage, mass effect, or midline shift. No hydrocephalus. The basilar cisterns are patent. Stable atrophy and chronic small vessel ischemic change. No evidence of  territorial infarct or acute ischemia. No extra-axial or intracranial fluid collection. Vascular: Atherosclerosis of skullbase vasculature without hyperdense vessel or abnormal calcification. Skull: No fracture or focal lesion. Sinuses/Orbits: Near complete opacification of right mastoid air cells and partial opacification of left mastoid air cells, unchanged from prior exam no acute findings. Bilateral cataract resection. Other: None. IMPRESSION: 1. No acute intracranial abnormality. No skull fracture. 2. Stable atrophy and chronic small vessel ischemic change. Electronically Signed   By: Narda Rutherford M.D.   On: 05/25/2023 18:29   DG Chest Port 1 View  Result Date: 05/25/2023 CLINICAL DATA:  Questionable sepsis - evaluate for abnormality EXAM: PORTABLE CHEST 1 VIEW COMPARISON:  05/04/2023 FINDINGS: Heart and mediastinal contours within normal limits. Diffuse aortic atherosclerosis. Platelike atelectasis at the right lung base. Left lung clear. No effusions or acute bony abnormality. IMPRESSION: Right base atelectasis. Aortic atherosclerosis. Electronically Signed   By: Charlett Nose M.D.   On: 05/25/2023 17:33   CT Hip Right Wo Contrast  Result Date: 05/04/2023 CLINICAL DATA:  Hip trauma, fracture suspected. Patient fell last night and has persistent hip pain. EXAM: CT OF THE RIGHT HIP WITHOUT CONTRAST TECHNIQUE: Multidetector CT imaging of the right hip was performed according to the standard protocol. Multiplanar CT image reconstructions were also generated. RADIATION DOSE REDUCTION: This exam was performed according to the departmental dose-optimization program which includes automated exposure control, adjustment of the mA and/or kV according to patient size and/or use of iterative reconstruction technique. COMPARISON:  Radiographs same date.  No other comparison studies. FINDINGS: Bones/Joint/Cartilage The bones are demineralized. No evidence of acute hip fracture, dislocation or femoral head  osteonecrosis. Mild right hip degenerative changes without significant joint effusion. The right sacroiliac joint appears unremarkable. There is focal angulation of the anterior cortex of the mid sacrum which is best seen on the sagittal images. There is a linear anterior cortical defect on the coronal images (image 78/7), but no adjacent soft tissue abnormality. Mild facet hypertrophy in the lower lumbar spine. 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 Ligaments Suboptimally assessed by CT. Muscles and Tendons Mild generalized muscular atrophy. No focal muscular hematoma or acute tendon abnormality identified. Soft tissues Mild nonspecific subcutaneous edema within the right buttocks. No focal fluid collection, foreign body or soft tissue emphysema identified. Iliofemoral atherosclerosis noted. 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 IMPRESSION: 1. No evidence of acute right hip fracture or dislocation. 2. Age indeterminate nondisplaced fracture of the mid sacrum. The right sacroiliac joint appears intact. 3. Mild nonspecific subcutaneous edema within the right buttocks. No focal fluid collection, foreign body or soft tissue emphysema identified. 4. 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 Electronically Signed   By: Carey Bullocks M.D.   On: 05/04/2023 15:15   DG Chest 1 View  Result Date: 05/04/2023 CLINICAL DATA:  Pain after fall EXAM: CHEST  1 VIEW COMPARISON:  None Available. FINDINGS: Hyperinflation. Slight linear opacity left lung base likely scar or atelectasis. No consolidation, pneumothorax or effusion. No edema. Normal cardiopericardial  silhouette. Calcified aorta. Overlapping cardiac leads. IMPRESSION: Hyperinflation. Slight left basilar atelectasis or scar. Calcified and tortuous aorta. Electronically  Signed   By: Karen Kays M.D.   On: 05/04/2023 11:43   DG Knee Complete 4 Views Right  Result Date: 05/04/2023 CLINICAL DATA:  Pain after fall EXAM: RIGHT KNEE - COMPLETE 5 VIEW COMPARISON:  None Available. FINDINGS: Osteopenia. No acute fracture or dislocation. Preserved joint spaces. Prominent vascular calcifications. No definite joint effusion. IMPRESSION: Severe osteopenia.  No acute osseous abnormality. Electronically Signed   By: Karen Kays M.D.   On: 05/04/2023 11:42   DG Hip Unilat W or Wo Pelvis 2-3 Views Right  Result Date: 05/04/2023 CLINICAL DATA:  Pain after fall EXAM: DG HIP (WITH OR WITHOUT PELVIS) 3V RIGHT COMPARISON:  None Available. FINDINGS: Osteopenia. No acute fracture or dislocation. Preserved joint spaces. Extensive vascular calcifications. With this level of severe osteopenia, evaluation for subtle nondisplaced injury is limited and if needed additional cross-sectional imaging study as clinically appropriate. IMPRESSION: Severe osteopenia.  No acute osseous abnormality. Electronically Signed   By: Karen Kays M.D.   On: 05/04/2023 11:41   CT Head Wo Contrast  Result Date: 05/04/2023 CLINICAL DATA:  Fall last night, found down EXAM: CT HEAD WITHOUT CONTRAST CT CERVICAL SPINE WITHOUT CONTRAST TECHNIQUE: Multidetector CT imaging of the head and cervical spine was performed following the standard protocol without intravenous contrast. Multiplanar CT image reconstructions of the cervical spine were also generated. RADIATION DOSE REDUCTION: This exam was performed according to the departmental dose-optimization program which includes automated exposure control, adjustment of the mA and/or kV according to patient size and/or use of iterative reconstruction technique. COMPARISON:  07/02/2022 FINDINGS: CT HEAD FINDINGS Brain: No evidence of acute infarction, hemorrhage, hydrocephalus, extra-axial collection or mass lesion/mass effect. Periventricular and deep white matter hypodensity.  Vascular: No hyperdense vessel or unexpected calcification. Skull: Normal. Negative for fracture or focal lesion. Sinuses/Orbits: Partial opacification of the mastoid air cells, right-greater-than-left. Other: None. CT CERVICAL SPINE FINDINGS Alignment: Normal. Skull base and vertebrae: No acute fracture. No primary bone lesion or focal pathologic process. Soft tissues and spinal canal: No prevertebral fluid or swelling. No visible canal hematoma. Disc levels: Mild multilevel disc space height loss and osteophytosis throughout the cervical spine. Upper chest: Partially imaged large nodule arising from the posterior right lobe of the thyroid (series 8, image 79). Other: None. IMPRESSION: 1. No acute intracranial pathology. Small-vessel white matter disease. 2. No fracture or static subluxation of the cervical spine. 3. Partially imaged large nodule arising from the posterior right lobe of the thyroid. In the setting of significant comorbidities or limited life expectancy, no follow-up recommended (ref: J Am Coll Radiol. 2015 Feb;12(2): 143-50). Electronically Signed   By: Jearld Lesch M.D.   On: 05/04/2023 11:20   CT Cervical Spine Wo Contrast  Result Date: 05/04/2023 CLINICAL DATA:  Fall last night, found down EXAM: CT HEAD WITHOUT CONTRAST CT CERVICAL SPINE WITHOUT CONTRAST TECHNIQUE: Multidetector CT imaging of the head and cervical spine was performed following the standard protocol without intravenous contrast. Multiplanar CT image reconstructions of the cervical spine were also generated. RADIATION DOSE REDUCTION: This exam was performed according to the departmental dose-optimization program which includes automated exposure control, adjustment of the mA and/or kV according to patient size and/or use of iterative reconstruction technique. COMPARISON:  07/02/2022 FINDINGS: CT HEAD FINDINGS Brain: No evidence of acute infarction, hemorrhage, hydrocephalus, extra-axial collection or mass lesion/mass effect.  Periventricular and deep white matter hypodensity.  Vascular: No hyperdense vessel or unexpected calcification. Skull: Normal. Negative for fracture or focal lesion. Sinuses/Orbits: Partial opacification of the mastoid air cells, right-greater-than-left. Other: None. CT CERVICAL SPINE FINDINGS Alignment: Normal. Skull base and vertebrae: No acute fracture. No primary bone lesion or focal pathologic process. Soft tissues and spinal canal: No prevertebral fluid or swelling. No visible canal hematoma. Disc levels: Mild multilevel disc space height loss and osteophytosis throughout the cervical spine. Upper chest: Partially imaged large nodule arising from the posterior right lobe of the thyroid (series 8, image 79). Other: None. IMPRESSION: 1. No acute intracranial pathology. Small-vessel white matter disease. 2. No fracture or static subluxation of the cervical spine. 3. Partially imaged large nodule arising from the posterior right lobe of the thyroid. In the setting of significant comorbidities or limited life expectancy, no follow-up recommended (ref: J Am Coll Radiol. 2015 Feb;12(2): 143-50). Electronically Signed   By: Jearld Lesch M.D.   On: 05/04/2023 11:20    Microbiology: Results for orders placed or performed during the hospital encounter of 05/25/23  Blood Culture (routine x 2)     Status: None (Preliminary result)   Collection Time: 05/25/23  5:30 PM   Specimen: BLOOD  Result Value Ref Range Status   Specimen Description BLOOD SITE NOT SPECIFIED  Final   Special Requests   Final    BOTTLES DRAWN AEROBIC AND ANAEROBIC Blood Culture adequate volume   Culture   Final    NO GROWTH 3 DAYS Performed at [facility], [address], [place], [place] [address]    Report Status PENDING  Incomplete  Urine Culture     Status: Abnormal   Collection Time: 05/25/23  5:41 PM   Specimen: Urine, Clean Catch  Result Value Ref Range Status   Specimen Description URINE, CLEAN CATCH  Final   Special  Requests   Final    NONE Reflexed from [id number] Performed at [facility], [address] [place], [place] [address]    Culture >=100,000 COLONIES/mL KLEBSIELLA PNEUMONIAE (A)  Final   Report Status 05/27/2023 FINAL  Final   Organism ID, Bacteria KLEBSIELLA PNEUMONIAE (A)  Final      Susceptibility   Klebsiella pneumoniae - MIC*    AMPICILLIN RESISTANT Resistant     CEFAZOLIN <=4 SENSITIVE Sensitive     CEFEPIME <=0.12 SENSITIVE Sensitive     CEFTRIAXONE <=0.25 SENSITIVE Sensitive     CIPROFLOXACIN <=0.25 SENSITIVE Sensitive     GENTAMICIN <=1 SENSITIVE Sensitive     IMIPENEM 0.5 SENSITIVE Sensitive     NITROFURANTOIN 64 INTERMEDIATE Intermediate     TRIMETH/SULFA <=20 SENSITIVE Sensitive     AMPICILLIN/SULBACTAM <=2 SENSITIVE Sensitive     PIP/TAZO <=4 SENSITIVE Sensitive     * >=100,000 COLONIES/mL KLEBSIELLA PNEUMONIAE  Resp panel by RT-PCR (RSV, Flu A&B, Covid) Anterior Nasal Swab     Status: None   Collection Time: 05/25/23  6:15 PM   Specimen: Anterior Nasal Swab  Result Value Ref Range Status   SARS Coronavirus 2 by RT PCR NEGATIVE NEGATIVE Final   Influenza A by PCR NEGATIVE NEGATIVE Final   Influenza B by PCR NEGATIVE NEGATIVE Final    Comment: (NOTE) The Xpert Xpress SARS-CoV-2/FLU/RSV plus assay is intended as an aid in the diagnosis of influenza from Nasopharyngeal swab specimens and should not be used as a sole basis for treatment. Nasal washings and aspirates are unacceptable for Xpert Xpress SARS-CoV-2/FLU/RSV testing.  Fact Sheet for Patients: BloggerCourse.com  Fact  Sheet for Healthcare Providers: SeriousBroker.it  This test is not yet approved or cleared by the [place] and has been authorized for detection and/or diagnosis of SARS-CoV-2 by FDA under an Emergency Use Authorization (EUA). This EUA will remain in effect (meaning this test can be used) for the duration of the COVID-19  declaration under Section 564(b)(1) of the Act, 21 U.S.C. section 360bbb-3(b)(1), unless the authorization is terminated or revoked.     Resp Syncytial Virus by PCR NEGATIVE NEGATIVE Final    Comment: (NOTE) Fact Sheet for Patients: BloggerCourse.com  Fact Sheet for Healthcare Providers: SeriousBroker.it  This test is not yet approved or cleared by the [place] FDA and has been authorized for detection and/or diagnosis of SARS-CoV-2 by FDA under an Emergency Use Authorization (EUA). This EUA will remain in effect (meaning this test can be used) for the duration of the COVID-19 declaration under Section 564(b)(1) of the Act, 21 U.S.C. section 360bbb-3(b)(1), unless the authorization is terminated or revoked.  Performed at [facility], [address] [place], [place] [address]   Blood Culture (routine x 2)     Status: None (Preliminary result)   Collection Time: 05/25/23  6:23 PM   Specimen: BLOOD  Result Value Ref Range Status   Specimen Description BLOOD SITE NOT SPECIFIED  Final   Special Requests   Final    BOTTLES DRAWN AEROBIC AND ANAEROBIC Blood Culture adequate volume   Culture   Final    NO GROWTH 3 DAYS Performed at [facility], [address] [place], [place] [address]    Report Status PENDING  Incomplete    Labs: CBC: Recent Labs  Lab 05/25/23 1713 05/25/23 1756 05/25/23 1757 05/26/23 0347  WBC 7.3  --   --  7.3  NEUTROABS 5.3  --   --   --   HGB 10.6* 11.6* 11.9* 11.1*  HCT 31.4* 34.0* 35.0* 33.0*  MCV 92.4  --   --  92.7  PLT 279  --   --  281   Basic Metabolic Panel: Recent Labs  Lab 05/25/23 1713 05/25/23 1756 05/25/23 1757 05/26/23 0347 05/27/23 0202 05/28/23 0449  NA 129* 128* 128* 127* 129* 138  K 3.9 3.8 3.8 3.3* 4.4 3.9  CL 91* 94*  --  95* 99 102  CO2 23  --   --  23 25 24   GLUCOSE 133* 128*  --  106* 93 94  BUN 16 15  --  12 9 5*  CREATININE 0.67 0.50  --   0.50 0.51 0.58  CALCIUM 7.8*  --   --  7.9* 8.0* 8.4*  MG 1.7  --   --   --   --   --    Liver Function Tests: Recent Labs  Lab 05/25/23 1713 05/26/23 0347  AST 31 26  ALT 15 16  ALKPHOS 91 88  BILITOT 0.7 0.7  PROT 5.9* 5.6*  ALBUMIN 2.7* 2.5*   CBG: Recent Labs  Lab 05/27/23 0411 05/27/23 0727 05/27/23 1128 05/27/23 1625 05/28/23 0719  GLUCAP 82 74 133* 129* 97    Discharge time spent: approximately 35 minutes spent on discharge counseling, evaluation of patient on day of discharge, and coordination of discharge planning with nursing, social work, pharmacy and case management  Signed: Alberteen Sam, MD [facility] 05/28/2023

## 2023-05-29 LAB — CULTURE, BLOOD (ROUTINE X 2)
Special Requests: ADEQUATE
Special Requests: ADEQUATE

## 2023-05-30 LAB — CULTURE, BLOOD (ROUTINE X 2)
Culture: NO GROWTH
Culture: NO GROWTH

## 2023-06-23 ENCOUNTER — Ambulatory Visit: Payer: Medicare Other | Admitting: Podiatry

## 2023-12-18 DEATH — deceased
